# Patient Record
Sex: Male | Born: 1985 | Race: White | Hispanic: No | Marital: Married | Smoking: Never smoker
Health system: Southern US, Community
[De-identification: ages and names within clinical notes are randomized; demographics above are authoritative.]

## PROBLEM LIST (undated history)

## (undated) DIAGNOSIS — R1013 Epigastric pain: Secondary | ICD-10-CM

## (undated) DIAGNOSIS — R109 Unspecified abdominal pain: Secondary | ICD-10-CM

## (undated) DIAGNOSIS — G8929 Other chronic pain: Secondary | ICD-10-CM

## (undated) DIAGNOSIS — F419 Anxiety disorder, unspecified: Secondary | ICD-10-CM

## (undated) DIAGNOSIS — I10 Essential (primary) hypertension: Secondary | ICD-10-CM

## (undated) DIAGNOSIS — F329 Major depressive disorder, single episode, unspecified: Secondary | ICD-10-CM

## (undated) DIAGNOSIS — G589 Mononeuropathy, unspecified: Secondary | ICD-10-CM

## (undated) DIAGNOSIS — F32A Depression, unspecified: Secondary | ICD-10-CM

## (undated) DIAGNOSIS — M1990 Unspecified osteoarthritis, unspecified site: Secondary | ICD-10-CM

## (undated) HISTORY — DX: Epigastric pain: R10.13

## (undated) HISTORY — DX: Major depressive disorder, single episode, unspecified: F32.9

## (undated) HISTORY — PX: APPENDECTOMY: SHX54

## (undated) HISTORY — DX: Anxiety disorder, unspecified: F41.9

## (undated) HISTORY — DX: Mononeuropathy, unspecified: G58.9

## (undated) HISTORY — DX: Depression, unspecified: F32.A

## (undated) HISTORY — DX: Morbid (severe) obesity due to excess calories: E66.01

---
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
## 2006-04-05 ENCOUNTER — Emergency Department (HOSPITAL_COMMUNITY): Admission: EM | Admit: 2006-04-05 | Discharge: 2006-04-05 | Payer: Self-pay | Admitting: Emergency Medicine

## 2013-11-21 ENCOUNTER — Ambulatory Visit (INDEPENDENT_AMBULATORY_CARE_PROVIDER_SITE_OTHER): Payer: BC Managed Care – PPO | Admitting: Family Medicine

## 2013-11-21 ENCOUNTER — Encounter: Payer: Self-pay | Admitting: Family Medicine

## 2013-11-21 VITALS — BP 138/88 | Temp 98.7°F | Ht 75.0 in | Wt 319.0 lb

## 2013-11-21 DIAGNOSIS — R109 Unspecified abdominal pain: Secondary | ICD-10-CM

## 2013-11-21 DIAGNOSIS — R3589 Other polyuria: Secondary | ICD-10-CM

## 2013-11-21 DIAGNOSIS — R358 Other polyuria: Secondary | ICD-10-CM

## 2013-11-21 LAB — HEPATIC FUNCTION PANEL
ALBUMIN: 4 g/dL (ref 3.5–5.2)
ALT: 30 U/L (ref 0–53)
AST: 25 U/L (ref 0–37)
Alkaline Phosphatase: 67 U/L (ref 39–117)
BILIRUBIN INDIRECT: 0.2 mg/dL (ref 0.2–1.2)
Bilirubin, Direct: 0.1 mg/dL (ref 0.0–0.3)
Total Bilirubin: 0.3 mg/dL (ref 0.3–1.2)
Total Protein: 7.7 g/dL (ref 6.0–8.3)

## 2013-11-21 LAB — CBC WITH DIFFERENTIAL/PLATELET
Basophils Absolute: 0 10*3/uL (ref 0.0–0.1)
Basophils Relative: 0 % (ref 0–1)
Eosinophils Absolute: 0.1 10*3/uL (ref 0.0–0.7)
Eosinophils Relative: 1 % (ref 0–5)
HCT: 44.5 % (ref 39.0–52.0)
HEMOGLOBIN: 15.4 g/dL (ref 13.0–17.0)
LYMPHS ABS: 2.5 10*3/uL (ref 0.7–4.0)
LYMPHS PCT: 22 % (ref 12–46)
MCH: 28.7 pg (ref 26.0–34.0)
MCHC: 34.6 g/dL (ref 30.0–36.0)
MCV: 83 fL (ref 78.0–100.0)
MONOS PCT: 9 % (ref 3–12)
Monocytes Absolute: 1 10*3/uL (ref 0.1–1.0)
NEUTROS ABS: 7.8 10*3/uL — AB (ref 1.7–7.7)
NEUTROS PCT: 68 % (ref 43–77)
Platelets: 312 10*3/uL (ref 150–400)
RBC: 5.36 MIL/uL (ref 4.22–5.81)
RDW: 12.8 % (ref 11.5–15.5)
WBC: 11.4 10*3/uL — AB (ref 4.0–10.5)

## 2013-11-21 LAB — AMYLASE: Amylase: 49 U/L (ref 0–105)

## 2013-11-21 LAB — POCT URINALYSIS DIPSTICK
SPEC GRAV UA: 1.02
pH, UA: 5

## 2013-11-21 LAB — POCT GLUCOSE (DEVICE FOR HOME USE): POC Glucose: 98 mg/dl (ref 70–99)

## 2013-11-21 LAB — LIPASE: Lipase: 30 U/L (ref 11–59)

## 2013-11-21 MED ORDER — PANTOPRAZOLE SODIUM 40 MG PO TBEC: 40.0000 mg | DELAYED_RELEASE_TABLET | Freq: Every day | ORAL | Status: DC

## 2013-11-21 NOTE — Progress Notes (Signed)
   Subjective:    Patient ID: Donald [name], male    DOB: 1986-01-06, 28 y.o.   MRN: [id number]  Abdominal Pain This is a new problem. The current episode started in the past 7 days. The pain is located in the left flank and right flank. The quality of the pain is sharp. Associated symptoms include a fever, headaches, nausea and vomiting. Associated symptoms comments: Chest pain. The pain is aggravated by eating. Treatments tried: ibuprofen for headache.   Present since Friuday Pains fluctuates. Peak 10/10 Some nausea occas vomiting No D No fever  He states the pain started off mild but over the past several days is having some sharp pains in the epigastric region. No chest tightness no shortness of breath  Review of Systems  Constitutional: Positive for fever.  Gastrointestinal: Positive for nausea, vomiting and abdominal pain.  Neurological: Positive for headaches.       Objective:   Physical Exam Neck no masses, lungs are clear respiratory rate normal, heart regular no murmurs, abdomen is soft with mild epigastric tenderness no guarding or rebound, skin warm dry       Assessment & Plan:  Abdominal pain discomfort probable gastritis proton pump inhibitor prescribed in addition to this minimize caffeine plus also lab work ordered await the results of these tests  If not improved over the course of the next 10-14 days consider gastroenterology referral for possible EGD. certainly if patient gets significantly worse over the next several days go to ER. Call if problems.  Urinalysis looks normal I find no evidence of infection I doubt kidney stones.

## 2013-11-22 NOTE — Progress Notes (Signed)
Patient notified and verbalized understanding of the test results. I told him he could try taking Tylenol for the pain, if that does not help, to call us back.

## 2014-03-02 ENCOUNTER — Ambulatory Visit (INDEPENDENT_AMBULATORY_CARE_PROVIDER_SITE_OTHER): Payer: BLUE CROSS/BLUE SHIELD | Admitting: Family Medicine

## 2014-03-02 ENCOUNTER — Encounter: Payer: Self-pay | Admitting: Family Medicine

## 2014-03-02 VITALS — BP 128/82 | Ht 75.0 in | Wt 318.6 lb

## 2014-03-02 DIAGNOSIS — K2990 Gastroduodenitis, unspecified, without bleeding: Secondary | ICD-10-CM

## 2014-03-02 DIAGNOSIS — R1013 Epigastric pain: Secondary | ICD-10-CM

## 2014-03-02 DIAGNOSIS — K2970 Gastritis, unspecified, without bleeding: Secondary | ICD-10-CM

## 2014-03-02 MED ORDER — PANTOPRAZOLE SODIUM 40 MG PO TBEC: 40.0000 mg | DELAYED_RELEASE_TABLET | Freq: Every day | ORAL | Status: DC

## 2014-03-02 NOTE — Progress Notes (Signed)
   Subjective:    Patient ID: Donald [name], male    DOB: November 14, 1985, 29 y.o.   MRN: [id number]  Abdominal Pain This is a recurrent problem. The current episode started more than 1 month ago. The onset quality is gradual. The problem occurs intermittently. The problem has been gradually worsening. The pain is located in the epigastric region. The pain is at a severity of 4/10. The pain is mild. The quality of the pain is burning. The abdominal pain radiates to the epigastric region and RUQ. Associated symptoms include anorexia. Pertinent negatives include no diarrhea, fever, hematochezia, melena or nausea. Associated symptoms comments: Worse with dairy products, still having the pain even without the dairy products. The pain is relieved by nothing. He has tried proton pump inhibitors for the symptoms. The treatment provided no relief.    Patient arrives with continued abd pain- Was seen in November 2015 for same sx and given Protinix which helped at first but is no longer working- Patient wonders if it could be gallbladder related. Patient was told if no better would need referral to GI doctor for a scope.   Review of Systems  Constitutional: Negative for fever.  HENT: Negative for congestion.   Respiratory: Negative for cough.   Cardiovascular: Negative for chest pain.  Gastrointestinal: Positive for abdominal pain and anorexia. Negative for nausea, diarrhea, melena and hematochezia.  Genitourinary: Negative for flank pain.       Objective:   Physical Exam  Constitutional: He appears well-nourished. No distress.  Cardiovascular: Normal rate, regular rhythm and normal heart sounds.   No murmur heard. Pulmonary/Chest: Effort normal and breath sounds normal. No respiratory distress.  Abdominal: Soft. Tenderness: mainly than the tenderness in epigastric region there is some tenderness in the right upper quadrant.  Musculoskeletal: He exhibits no edema.  Lymphadenopathy:    He has no  cervical adenopathy.  Neurological: He is alert.  Psychiatric: His behavior is normal.  Vitals reviewed.         Assessment & Plan:  Abdominal pain persistent could well be gastritis could be gallbladder disease. I doubt an ulcer. The patient has been on a PPI for over 60 days he states he initially got better then got worse I would recommend that we continue the PPI. Use Maalox on a when necessary basis minimize acidic foods caffeine's chocolates. Referral to gastroenterology for EGD. In addition to this gallbladder ultrasound. Patient is aware of all of this.

## 2014-03-02 NOTE — Patient Instructions (Signed)
As part of today's visit a referral has been made. This is a process that is handled by our clinical referral specialists. This process requires that we send your medical information to the specialists for their review before they will issue you an appointment. Unfortunately this does take time and much of this process is under the responsibility of the specialists. Our referral specialist will make certain that your insurance company is notified as well as the physician group that we are referring you to for your problem. Emergent referrals are made as quick as possible. Most standard referrals often take 7-10 days before we hear from the specialists office when they can see you. If you have not heard when your appointment is from us or the referral specialists within 7-10 days please call us regarding this referral.   May use Maalox up to 2 tablespoons 4 times a day as needed for abdominal pain  We will set you up with the GI doctor  Please do your ultrasound/ this will look at your Gallbladder

## 2014-03-06 ENCOUNTER — Encounter: Payer: Self-pay | Admitting: Internal Medicine

## 2014-03-06 ENCOUNTER — Ambulatory Visit (HOSPITAL_COMMUNITY)
Admission: RE | Admit: 2014-03-06 | Discharge: 2014-03-06 | Disposition: A | Discharge status: Home / Self Care | Payer: BLUE CROSS/BLUE SHIELD | Source: Ambulatory Visit | Attending: Family Medicine | Admitting: Family Medicine

## 2014-03-06 DIAGNOSIS — G8929 Other chronic pain: Secondary | ICD-10-CM | POA: Insufficient documentation

## 2014-03-06 DIAGNOSIS — E669 Obesity, unspecified: Secondary | ICD-10-CM | POA: Diagnosis not present

## 2014-03-06 DIAGNOSIS — R1013 Epigastric pain: Secondary | ICD-10-CM | POA: Insufficient documentation

## 2014-03-20 ENCOUNTER — Encounter: Payer: Self-pay | Admitting: Nurse Practitioner

## 2014-03-20 ENCOUNTER — Other Ambulatory Visit: Payer: Self-pay

## 2014-03-20 ENCOUNTER — Ambulatory Visit (INDEPENDENT_AMBULATORY_CARE_PROVIDER_SITE_OTHER): Payer: BLUE CROSS/BLUE SHIELD | Admitting: Nurse Practitioner

## 2014-03-20 VITALS — BP 127/78 | HR 89 | Temp 99.0°F | Ht 74.0 in | Wt 316.8 lb

## 2014-03-20 DIAGNOSIS — G8929 Other chronic pain: Secondary | ICD-10-CM

## 2014-03-20 DIAGNOSIS — R1013 Epigastric pain: Secondary | ICD-10-CM

## 2014-03-20 MED ORDER — RANITIDINE HCL 150 MG PO TABS
150.0000 mg | ORAL_TABLET | Freq: Every day | ORAL | Status: DC
Start: 2014-03-20 — End: 2015-04-10

## 2014-03-20 MED ORDER — SUCRALFATE 1 GM/10ML PO SUSP: 1.0000 g | Freq: Three times a day (TID) | ORAL | Status: DC | PRN

## 2014-03-20 NOTE — Assessment & Plan Note (Signed)
Persistent dyspepsia and epigastric pain initially improved with Protonix but has since returned. Likely esophagitis/gastritis. Needs EGD workup for persistent dyspepsia on PPI. In the meantime start Zantac 150 mg q hs and Carafate tid prn breakthrough symptoms. Abdominal US wnl, no gallstones. Also counseled to avoid dairy products for now as these seem to exacerbate his symptoms. No red flag/warning signs. Return for follow-up after EGD in approximately 8 weeks.  Proceed with EGD with Dr. Jena Gaussourk in near future: the risks, benefits, and alternatives have been discussed with the patient in detail. The patient states understanding and desires to proceed.

## 2014-03-20 NOTE — Assessment & Plan Note (Signed)
Persistent dyspepsia and epigastric pain initially improved with Protonix but has since returned. Likely esophagitis/gastritis. Needs EGD workup for persistent dyspepsia on PPI. In the meantime start Zantac 150 mg q hs and Carafate tid prn breakthrough symptoms. Abdominal US wnl, no gallstones. Also counseled to avoid dairy products for now as these seem to exacerbate his symptoms. No red flag/warning signs. Return for follow-up after EGD in approximately 8 weeks.  Proceed with EGD with Dr. Rourk in near future: the risks, benefits, and alternatives have been discussed with the patient in detail. The patient states understanding and desires to proceed.  

## 2014-03-20 NOTE — Progress Notes (Signed)
Primary Care Physician:  Lilyan Punt, MD Primary Gastroenterologist:  Dr. Jena Gauss  Chief Complaint  Patient presents with  . Abdominal Pain    x 2 months  . Nausea    HPI:   29 year old male presents on referral from PCP for abdominal pain which started in January of this year (approximately 2 months ago). His pain was intermittent and epigastric and worsening and described to PCP as burning and radiating to the RUQ. Denied GI bleeding. Associated worsening symptoms with dairy products and has tried PPI without relief. Was initially tried on Protonix by PCP with initial improvement but no longer working. Abd US/RUQ with possible fatty infiltration of the liver, no gallstones, obscured pancreas with bowel gas, and slight prominence of the spleen. Last labs included normal Amylase/Lipase, normal HFP.  Today he states he's having continued epigastric pain and bloating with pain that radiates to his RUQ and sides. Pain is sharp/burning but with a constant dull underlying pain. Pain can be intermittent at times and other times it is constant. Has occasional nausea, denies vomiting. Denies dysphagia, regurgitation, denies acidic or bitter taste in his mouth. Denies hematochezia or melena. Has not tried the Maalox like recommended by his PCP. States nothing has helped. Symptoms all started in January. No associated timing, occurs at random. Has a bowel movement typically once a week at the rarest to every couple/few days. Drinks minimal water, no fiber supplements, eats daily vegetables. Used to take NSAIDs occasionally for headache but has stopped taking them on his PCP's advice. Denies ASA powders. Symptoms tend to be worse with daily products. Denies any other upper or lower GI problems. Denies fever, chills, unintended weight loss, decrease in appetite/poor appetite, chest pain, dyspnea.  Past Medical History  Diagnosis Date  . Dyspepsia   . Pinched nerve     in his back    Past Surgical  History  Procedure Laterality Date  . None      None to date 03/20/14    Current Outpatient Prescriptions  Medication Sig Dispense Refill  . pantoprazole (PROTONIX) 40 MG tablet Take 1 tablet (40 mg total) by mouth daily. 30 tablet 3  . Vitamin D, Ergocalciferol, (DRISDOL) 50000 UNITS CAPS capsule Take 50,000 Units by mouth every 7 (seven) days.     No current facility-administered medications for this visit.    Allergies as of 03/20/2014  . (No Known Allergies)    Family History  Problem Relation Age of Onset  . Colon cancer Neg Hx   . Stomach cancer Neg Hx   . Diabetes Father   . Hypertension Father     History   Social History  . Marital Status: Single    Spouse Name: N/A  . Number of Children: N/A  . Years of Education: N/A   Occupational History  . Not on file.   Social History Main Topics  . Smoking status: Never Smoker   . Smokeless tobacco: Not on file  . Alcohol Use: No     Comment: rarely, "once in a bue moon"  . Drug Use: No  . Sexual Activity: Not on file   Other Topics Concern  . Not on file   Social History Narrative    Review of Systems: Gen: Denies any fever, chills, fatigue, weight loss, lack of appetite.  CV: Denies chest pain, heart palpitations, syncope.  Resp: Denies shortness of breath at rest or with exertion. Denies wheezing or cough.  GI: See HPI. Denies dysphagia or  odynophagia. Denies jaundice, hematemesis. MS: Denies joint pain, muscle weakness, cramps, or limitation of movement.  Derm: Denies rash, itching, dry skin Psych: Denies depression, anxiety, memory loss, and confusion Heme: Denies bruising, bleeding, and enlarged lymph nodes.  Physical Exam: BP 127/78 mmHg  Pulse 89  Temp(Src) 99 F (37.2 C) (Oral)  Ht 6\' 2"  (1.88 m)  Wt 316 lb 12.8 oz (143.7 kg)  BMI 40.66 kg/m2 General:   Alert and oriented. Morbidly obese. Pleasant and cooperative. Well-nourished and well-developed.  Head:  Normocephalic and  atraumatic. Eyes:  Without icterus, sclera clear and conjunctiva pink.  Ears:  Normal auditory acuity. Mouth:  No deformity or lesions, oral mucosa pink. No OP edema. Neck:  Supple, without mass or thyromegaly. Lungs:  Clear to auscultation bilaterally. No wheezes, rales, or rhonchi. No distress.  Heart:  S1, S2 present without murmurs appreciated.  Abdomen:  +BS, soft, and non-distended. Mild to moderate epigastric TTP. No HSM noted. No guarding or rebound. No masses appreciated.  Rectal:  Deferred  Msk:  Symmetrical without gross deformities. Normal posture. Pulses:  Normal pulses noted. Extremities:  Without clubbing or edema. Neurologic:  Alert and  oriented x4;  grossly normal neurologically. Skin:  Intact without significant lesions or rashes. Cervical Nodes:  No significant cervical adenopathy. Psych:  Alert and cooperative. Normal mood and affect.     03/20/2014 12:05 PM

## 2014-03-20 NOTE — Progress Notes (Signed)
CC'ED TO PCP 

## 2014-03-20 NOTE — Patient Instructions (Addendum)
1. We are adding a medication to take at night (Zantag 150 mg) to try and get better control of your symptoms 2. We are also adding a medicine you can take up to 3 times a day for severe symptoms (Carafate) 3. We will schedule you for an endoscopy to further evaluate if you have severe erosions in your esophagus or stomach that could be causing your symptoms. 4. Return for evaluation in 8 weeks, after your procedure. 5. Avoid daily products for now.  Place gastroesophageal reflux patient instructions here.

## 2014-03-26 ENCOUNTER — Ambulatory Visit: Payer: BLUE CROSS/BLUE SHIELD | Admitting: Nurse Practitioner

## 2014-04-02 ENCOUNTER — Encounter (HOSPITAL_COMMUNITY)
Admission: RE | Disposition: A | Discharge status: Home / Self Care | Payer: Self-pay | Source: Ambulatory Visit | Attending: Internal Medicine

## 2014-04-02 ENCOUNTER — Encounter (HOSPITAL_COMMUNITY): Payer: Self-pay | Admitting: *Deleted

## 2014-04-02 ENCOUNTER — Ambulatory Visit (HOSPITAL_COMMUNITY)
Admission: RE | Admit: 2014-04-02 | Discharge: 2014-04-02 | Disposition: A | Discharge status: Home / Self Care | Payer: BLUE CROSS/BLUE SHIELD | Source: Ambulatory Visit | Attending: Internal Medicine | Admitting: Internal Medicine

## 2014-04-02 DIAGNOSIS — K449 Diaphragmatic hernia without obstruction or gangrene: Secondary | ICD-10-CM | POA: Diagnosis not present

## 2014-04-02 DIAGNOSIS — K3189 Other diseases of stomach and duodenum: Secondary | ICD-10-CM | POA: Insufficient documentation

## 2014-04-02 DIAGNOSIS — K317 Polyp of stomach and duodenum: Secondary | ICD-10-CM | POA: Insufficient documentation

## 2014-04-02 DIAGNOSIS — R1013 Epigastric pain: Secondary | ICD-10-CM

## 2014-04-02 DIAGNOSIS — Z79899 Other long term (current) drug therapy: Secondary | ICD-10-CM | POA: Diagnosis not present

## 2014-04-02 HISTORY — PX: ESOPHAGOGASTRODUODENOSCOPY: SHX5428

## 2014-04-02 SURGERY — EGD (ESOPHAGOGASTRODUODENOSCOPY)
Anesthesia: Moderate Sedation

## 2014-04-02 MED ORDER — MEPERIDINE HCL 100 MG/ML IJ SOLN
INTRAMUSCULAR | Status: AC
  Filled 2014-04-02: qty 2

## 2014-04-02 MED ORDER — ONDANSETRON HCL 4 MG/2ML IJ SOLN
INTRAMUSCULAR | Status: DC | PRN
  Administered 2014-04-02: 4 mg via INTRAVENOUS

## 2014-04-02 MED ORDER — ONDANSETRON HCL 4 MG/2ML IJ SOLN
INTRAMUSCULAR | Status: AC
  Filled 2014-04-02: qty 2

## 2014-04-02 MED ORDER — STERILE WATER FOR IRRIGATION IR SOLN
Status: DC | PRN
  Administered 2014-04-02: 15:00:00

## 2014-04-02 MED ORDER — LIDOCAINE VISCOUS 2 % MT SOLN
OROMUCOSAL | Status: DC | PRN
  Administered 2014-04-02: 3 mL via OROMUCOSAL

## 2014-04-02 MED ORDER — MEPERIDINE HCL 100 MG/ML IJ SOLN
INTRAMUSCULAR | Status: DC | PRN
  Administered 2014-04-02 (×3): 50 mg via INTRAVENOUS
  Administered 2014-04-02: 25 mg via INTRAVENOUS

## 2014-04-02 MED ORDER — SODIUM CHLORIDE 0.9 % IV SOLN
INTRAVENOUS | Status: DC
  Administered 2014-04-02: 14:00:00 via INTRAVENOUS

## 2014-04-02 MED ORDER — MIDAZOLAM HCL 5 MG/5ML IJ SOLN
INTRAMUSCULAR | Status: AC
  Filled 2014-04-02: qty 10

## 2014-04-02 MED ORDER — MIDAZOLAM HCL 5 MG/5ML IJ SOLN
INTRAMUSCULAR | Status: DC | PRN
  Administered 2014-04-02 (×2): 2 mg via INTRAVENOUS
  Administered 2014-04-02: 1 mg via INTRAVENOUS
  Administered 2014-04-02: 2 mg via INTRAVENOUS

## 2014-04-02 MED ORDER — LIDOCAINE VISCOUS 2 % MT SOLN
OROMUCOSAL | Status: AC
  Filled 2014-04-02: qty 15

## 2014-04-02 NOTE — H&P (View-Only) (Signed)
  Primary Care Physician:  LUKING,SCOTT, MD Primary Gastroenterologist:  Dr. Rourk  Chief Complaint  Patient presents with  . Abdominal Pain    x 2 months  . Nausea    HPI:   29 year old male presents on referral from PCP for abdominal pain which started in January of this year (approximately 2 months ago). His pain was intermittent and epigastric and worsening and described to PCP as burning and radiating to the RUQ. Denied GI bleeding. Associated worsening symptoms with dairy products and has tried PPI without relief. Was initially tried on Protonix by PCP with initial improvement but no longer working. Abd US/RUQ with possible fatty infiltration of the liver, no gallstones, obscured pancreas with bowel gas, and slight prominence of the spleen. Last labs included normal Amylase/Lipase, normal HFP.  Today he states he's having continued epigastric pain and bloating with pain that radiates to his RUQ and sides. Pain is sharp/burning but with a constant dull underlying pain. Pain can be intermittent at times and other times it is constant. Has occasional nausea, denies vomiting. Denies dysphagia, regurgitation, denies acidic or bitter taste in his mouth. Denies hematochezia or melena. Has not tried the Maalox like recommended by his PCP. States nothing has helped. Symptoms all started in January. No associated timing, occurs at random. Has a bowel movement typically once a week at the rarest to every couple/few days. Drinks minimal water, no fiber supplements, eats daily vegetables. Used to take NSAIDs occasionally for headache but has stopped taking them on his PCP's advice. Denies ASA powders. Symptoms tend to be worse with daily products. Denies any other upper or lower GI problems. Denies fever, chills, unintended weight loss, decrease in appetite/poor appetite, chest pain, dyspnea.  Past Medical History  Diagnosis Date  . Dyspepsia   . Pinched nerve     in his back    Past Surgical  History  Procedure Laterality Date  . None      None to date 03/20/14    Current Outpatient Prescriptions  Medication Sig Dispense Refill  . pantoprazole (PROTONIX) 40 MG tablet Take 1 tablet (40 mg total) by mouth daily. 30 tablet 3  . Vitamin D, Ergocalciferol, (DRISDOL) 50000 UNITS CAPS capsule Take 50,000 Units by mouth every 7 (seven) days.     No current facility-administered medications for this visit.    Allergies as of 03/20/2014  . (No Known Allergies)    Family History  Problem Relation Age of Onset  . Colon cancer Neg Hx   . Stomach cancer Neg Hx   . Diabetes Father   . Hypertension Father     History   Social History  . Marital Status: Single    Spouse Name: N/A  . Number of Children: N/A  . Years of Education: N/A   Occupational History  . Not on file.   Social History Main Topics  . Smoking status: Never Smoker   . Smokeless tobacco: Not on file  . Alcohol Use: No     Comment: rarely, "once in a bue moon"  . Drug Use: No  . Sexual Activity: Not on file   Other Topics Concern  . Not on file   Social History Narrative    Review of Systems: Gen: Denies any fever, chills, fatigue, weight loss, lack of appetite.  CV: Denies chest pain, heart palpitations, syncope.  Resp: Denies shortness of breath at rest or with exertion. Denies wheezing or cough.  GI: See HPI. Denies dysphagia or   odynophagia. Denies jaundice, hematemesis. MS: Denies joint pain, muscle weakness, cramps, or limitation of movement.  Derm: Denies rash, itching, dry skin Psych: Denies depression, anxiety, memory loss, and confusion Heme: Denies bruising, bleeding, and enlarged lymph nodes.  Physical Exam: BP 127/78 mmHg  Pulse 89  Temp(Src) 99 F (37.2 C) (Oral)  Ht 6' 2" (1.88 m)  Wt 316 lb 12.8 oz (143.7 kg)  BMI 40.66 kg/m2 General:   Alert and oriented. Morbidly obese. Pleasant and cooperative. Well-nourished and well-developed.  Head:  Normocephalic and  atraumatic. Eyes:  Without icterus, sclera clear and conjunctiva pink.  Ears:  Normal auditory acuity. Mouth:  No deformity or lesions, oral mucosa pink. No OP edema. Neck:  Supple, without mass or thyromegaly. Lungs:  Clear to auscultation bilaterally. No wheezes, rales, or rhonchi. No distress.  Heart:  S1, S2 present without murmurs appreciated.  Abdomen:  +BS, soft, and non-distended. Mild to moderate epigastric TTP. No HSM noted. No guarding or rebound. No masses appreciated.  Rectal:  Deferred  Msk:  Symmetrical without gross deformities. Normal posture. Pulses:  Normal pulses noted. Extremities:  Without clubbing or edema. Neurologic:  Alert and  oriented x4;  grossly normal neurologically. Skin:  Intact without significant lesions or rashes. Cervical Nodes:  No significant cervical adenopathy. Psych:  Alert and cooperative. Normal mood and affect.     03/20/2014 12:05 PM  

## 2014-04-02 NOTE — Op Note (Signed)
[facility] [address] [place], [id number]   ENDOSCOPY PROCEDURE REPORT  PATIENT: Donald [name], Donald [name]  MR#: [id number] BIRTHDATE: 26-May-1985 , 28  yrs. old GENDER: male ENDOSCOPIST: R.  Roetta SessionsMichael Charlett Merkle, MD FACP FACG REFERRED BY:  Lilyan PuntScott Luking, [name].D. PROCEDURE DATE:  04/02/2014 PROCEDURE:  EGD w/ biopsy INDICATIONS:  Epigastric and right upper quadrant abdominal pain. MEDICATIONS: Versed 7 mg IV and Demerol 175 mg IV in divided doses. Zofran 4 mg IV.  Xylocaine gel orally. ASA CLASS:  CONSENT: The risks, benefits, limitations, alternatives and imponderables have been discussed.  The potential for biopsy, esophogeal dilation, etc. have also been reviewed.  Questions have been answered.  All parties agreeable.  Please see the history and physical in the medical record for more information.  DESCRIPTION OF PROCEDURE: After the risks benefits and alternatives of the procedure were thoroughly explained, informed consent was obtained.  The [id number] ([id number] endoscope was introduced through the mouth and advanced to the second portion of the duodenum , limited by Without limitations. The instrument was slowly withdrawn as the mucosa was fully examined.    Normal-appearing tubular esophagus.  Stomach empty patient a couple 1-3 mm benign-appearing fundal gland polyps in the cardia.  Patient had tiny hiatal hernia.  Otherwise, the gastric mucosa appeared normal.  Patent pylorus.  Normal first and second portion of the duodenum.  Retroflexed views revealed a hiatal hernia. One of the gastric polyps was biopsied for histologic study.    The scope was then withdrawn from the patient and the procedure completed.  COMPLICATIONS: There were no immediate complications.  ENDOSCOPIC IMPRESSION: Very small hiatal hernia. Gastric polyps?"status post biopsy. No endoscopic explanation for patient's symptoms  RECOMMENDATIONS: Trial of dexilant 60 mg daily.   If Dexilant does not  abolish symptoms, then further evaluation of the gallbladder via a HIDA scan would be indicated.  REPEAT EXAM:  eSigned:  R. Roetta SessionsMichael Ayauna Mcnay, MD Jerrel IvoryFACP [facility] HospitalFACG 04/02/2014 3:11 PM    CC:  CPT CODES: ICD CODES:  The ICD and CPT codes recommended by this software are interpretations from the data that the clinical staff has captured with the software.  The verification of the translation of this report to the ICD and CPT codes and modifiers is the sole responsibility of the health care institution and practicing physician where this report was generated.  [employer] will not be held responsible for the validity of the ICD and CPT codes included on this report.  AMA assumes no liability for data contained or not contained herein. CPT is a Publishing rights managerregistered trademark of the [employer].  PATIENT NAME:  Donald [name], Donald [name] MR#: [id number]

## 2014-04-02 NOTE — Discharge Instructions (Addendum)
EGD Discharge instructions Please read the instructions outlined below and refer to this sheet in the next few weeks. These discharge instructions provide you with general information on caring for yourself after you leave the hospital. Your doctor may also give you specific instructions. While your treatment has been planned according to the most current medical practices available, unavoidable complications occasionally occur. If you have any problems or questions after discharge, please call your doctor. ACTIVITY  You may resume your regular activity but move at a slower pace for the next 24 hours.   Take frequent rest periods for the next 24 hours.   Walking will help expel (get rid of) the air and reduce the bloated feeling in your abdomen.   No driving for 24 hours (because of the anesthesia (medicine) used during the test).   You may shower.   Do not sign any important legal documents or operate any machinery for 24 hours (because of the anesthesia used during the test).  NUTRITION  Drink plenty of fluids.   You may resume your normal diet.   Begin with a light meal and progress to your normal diet.   Avoid alcoholic beverages for 24 hours or as instructed by your caregiver.  MEDICATIONS  You may resume your normal medications unless your caregiver tells you otherwise.  WHAT YOU CAN EXPECT TODAY  You may experience abdominal discomfort such as a feeling of fullness or gas pains.  FOLLOW-UP  Your doctor will discuss the results of your test with you.  SEEK IMMEDIATE MEDICAL ATTENTION IF ANY OF THE FOLLOWING OCCUR:  Excessive nausea (feeling sick to your stomach) and/or vomiting.   Severe abdominal pain and distention (swelling).   Trouble swallowing.   Temperature over 101 F (37.8 C).   Rectal bleeding or vomiting of blood.     GERD information provided  Stop Zantac; try three-week course of the dexilant 60 mg daily -go by my office for a 3 week supply of  samples.  If this new medication does not help your symptoms, then further evaluation of your gallbladder would be needed. \ Gastroesophageal Reflux Disease, Adult Gastroesophageal reflux disease (GERD) happens when acid from your stomach flows up into the esophagus. When acid comes in contact with the esophagus, the acid causes soreness (inflammation) in the esophagus. Over time, GERD may create small holes (ulcers) in the lining of the esophagus. CAUSES  Increased body weight. This puts pressure on the stomach, making acid rise from the stomach into the esophagus. Smoking. This increases acid production in the stomach. Drinking alcohol. This causes decreased pressure in the lower esophageal sphincter (valve or ring of muscle between the esophagus and stomach), allowing acid from the stomach into the esophagus. Late evening meals and a full stomach. This increases pressure and acid production in the stomach. A malformed lower esophageal sphincter. Sometimes, no cause is found. SYMPTOMS  Burning pain in the lower part of the mid-chest behind the breastbone and in the mid-stomach area. This may occur twice a week or more often. Trouble swallowing. Sore throat. Dry cough. Asthma-like symptoms including chest tightness, shortness of breath, or wheezing. DIAGNOSIS  Your caregiver may be able to diagnose GERD based on your symptoms. In some cases, X-rays and other tests may be done to check for complications or to check the condition of your stomach and esophagus. TREATMENT  Your caregiver may recommend over-the-counter or prescription medicines to help decrease acid production. Ask your caregiver before starting or adding any new medicines.  HOME CARE INSTRUCTIONS  Change the factors that you can control. Ask your caregiver for guidance concerning weight loss, quitting smoking, and alcohol consumption. Avoid foods and drinks that make your symptoms worse, such as: Caffeine or alcoholic  drinks. Chocolate. Peppermint or mint flavorings. Garlic and onions. Spicy foods. Citrus fruits, such as oranges, lemons, or limes. Tomato-based foods such as sauce, chili, salsa, and pizza. Fried and fatty foods. Avoid lying down for the 3 hours prior to your bedtime or prior to taking a nap. Eat small, frequent meals instead of large meals. Wear loose-fitting clothing. Do not wear anything tight around your waist that causes pressure on your stomach. Raise the head of your bed 6 to 8 inches with wood blocks to help you sleep. Extra pillows will not help. Only take over-the-counter or prescription medicines for pain, discomfort, or fever as directed by your caregiver. Do not take aspirin, ibuprofen, or other nonsteroidal anti-inflammatory drugs (NSAIDs). SEEK IMMEDIATE MEDICAL CARE IF:  You have pain in your arms, neck, jaw, teeth, or back. Your pain increases or changes in intensity or duration. You develop nausea, vomiting, or sweating (diaphoresis). You develop shortness of breath, or you faint. Your vomit is green, yellow, black, or looks like coffee grounds or blood. Your stool is red, bloody, or black. These symptoms could be signs of other problems, such as heart disease, gastric bleeding, or esophageal bleeding. MAKE SURE YOU:  Understand these instructions. Will watch your condition. Will get help right away if you are not doing well or get worse. Document Released: 10/08/2004 Document Revised: 03/23/2011 Document Reviewed: 07/18/2010 [facility] Patient Information 2015 [place], [place]. This information is not intended to replace advice given to you by your health care provider. Make sure you discuss any questions you have with your health care provider.

## 2014-04-02 NOTE — Interval H&P Note (Signed)
History and Physical Interval Note:  04/02/2014 2:43 PM  Donald [name]  has presented today for surgery, with the diagnosis of dyspepsia, epigstric pain  The various methods of treatment have been discussed with the patient and family. After consideration of risks, benefits and other options for treatment, the patient has consented to  Procedure(s) with comments: ESOPHAGOGASTRODUODENOSCOPY (EGD) (N/A) - 245pm as a surgical intervention .  The patient's history has been reviewed, patient examined, no change in status, stable for surgery.  I have reviewed the patient's chart and labs.  Questions were answered to the patient's satisfaction.     Donald [name]  No change. EGD per plan.The risks, benefits, limitations, alternatives and imponderables have been reviewed with the patient. Potential for esophageal dilation, biopsy, etc. have also been reviewed.  Questions have been answered. All parties agreeable.

## 2014-04-04 ENCOUNTER — Telehealth: Payer: Self-pay | Admitting: Internal Medicine

## 2014-04-04 ENCOUNTER — Other Ambulatory Visit: Payer: Self-pay

## 2014-04-04 ENCOUNTER — Encounter (HOSPITAL_COMMUNITY): Payer: Self-pay | Admitting: Internal Medicine

## 2014-04-04 DIAGNOSIS — R109 Unspecified abdominal pain: Secondary | ICD-10-CM

## 2014-04-04 NOTE — Telephone Encounter (Signed)
Continue with my recommendations on the day of the endoscopy. Continue Dexilant 60 mg daily. I would wait until at least the first of next week before drawing any conclusions as to the effectiveness of the Dexilant. If persisting symptoms, would pursue a HIDA scan. In fact, might go ahead and make an appointment for a HIDA with [facility]  Tuesday or Wednesday of next week.

## 2014-04-04 NOTE — Telephone Encounter (Signed)
Spoke with pts wife because he is at work today. Pt is still having R side abd pain. She said he has swelling in his abd that gets worse after he eats. He does have some nausea but no vomiting. No fever. He did start dexilant 2 days ago.  Any further recommendations?

## 2014-04-04 NOTE — Telephone Encounter (Signed)
No pre cert needed for HIDA . HIDA is set up for 04/10/2014. Pt is aware to be fasting and not to take any pain medications.

## 2014-04-04 NOTE — Telephone Encounter (Signed)
Pt's wife called to say that patient had EGD done on Monday by [facility] and is having chest and side pain with swelling. Please call with recommendations [phone]

## 2014-04-04 NOTE — Telephone Encounter (Signed)
pts wife is aware- she said it was ok to set up HIDA scan. Please schedule. She said either really early in the morning or late in the afternoon if possible.

## 2014-04-10 ENCOUNTER — Encounter (HOSPITAL_COMMUNITY): Payer: Self-pay

## 2014-04-10 ENCOUNTER — Ambulatory Visit (HOSPITAL_COMMUNITY)
Admission: RE | Admit: 2014-04-10 | Discharge: 2014-04-10 | Disposition: A | Discharge status: Home / Self Care | Payer: BLUE CROSS/BLUE SHIELD | Source: Ambulatory Visit | Attending: Internal Medicine | Admitting: Internal Medicine

## 2014-04-10 DIAGNOSIS — R109 Unspecified abdominal pain: Secondary | ICD-10-CM | POA: Insufficient documentation

## 2014-04-10 MED ORDER — SODIUM CHLORIDE 0.9 % IJ SOLN
INTRAMUSCULAR | Status: AC
  Filled 2014-04-10: qty 18

## 2014-04-10 MED ORDER — TECHNETIUM TC 99M MEBROFENIN IV KIT
5.0000 | PACK | Freq: Once | INTRAVENOUS | Status: AC | PRN
  Administered 2014-04-10: 5 via INTRAVENOUS

## 2014-04-10 MED ORDER — SINCALIDE 5 MCG IJ SOLR
INTRAMUSCULAR | Status: AC
  Administered 2014-04-10: 2.87 ug via INTRAVENOUS
  Filled 2014-04-10: qty 5

## 2014-04-10 MED ORDER — STERILE WATER FOR INJECTION IJ SOLN
INTRAMUSCULAR | Status: AC
  Administered 2014-04-10: 2.87 mL via INTRAVENOUS
  Filled 2014-04-10: qty 10

## 2014-04-18 NOTE — Progress Notes (Signed)
PATIENT HAS APPOINTMENT 05/15/14

## 2014-04-29 ENCOUNTER — Encounter: Payer: Self-pay | Admitting: Internal Medicine

## 2014-05-15 ENCOUNTER — Encounter: Payer: Self-pay | Admitting: Nurse Practitioner

## 2014-05-15 ENCOUNTER — Telehealth: Payer: Self-pay | Admitting: Internal Medicine

## 2014-05-15 ENCOUNTER — Ambulatory Visit: Payer: BLUE CROSS/BLUE SHIELD | Admitting: Nurse Practitioner

## 2014-05-15 NOTE — Telephone Encounter (Signed)
PATIENT WAS A NO SHOW AND LETTER WAS SENT  °

## 2014-05-25 NOTE — Telephone Encounter (Signed)
Noted  

## 2015-04-10 ENCOUNTER — Encounter: Payer: Self-pay | Admitting: Family Medicine

## 2015-04-10 ENCOUNTER — Ambulatory Visit (INDEPENDENT_AMBULATORY_CARE_PROVIDER_SITE_OTHER): Payer: BLUE CROSS/BLUE SHIELD | Admitting: Family Medicine

## 2015-04-10 VITALS — BP 138/84 | Temp 98.4°F | Ht 75.0 in | Wt 343.0 lb

## 2015-04-10 DIAGNOSIS — J301 Allergic rhinitis due to pollen: Secondary | ICD-10-CM | POA: Diagnosis not present

## 2015-04-10 DIAGNOSIS — J019 Acute sinusitis, unspecified: Secondary | ICD-10-CM | POA: Diagnosis not present

## 2015-04-10 DIAGNOSIS — B9689 Other specified bacterial agents as the cause of diseases classified elsewhere: Secondary | ICD-10-CM

## 2015-04-10 MED ORDER — AMOXICILLIN-POT CLAVULANATE 875-125 MG PO TABS: 1.0000 | ORAL_TABLET | Freq: Two times a day (BID) | ORAL | Status: DC

## 2015-04-10 MED ORDER — FLUTICASONE PROPIONATE 50 MCG/ACT NA SUSP: 2.0000 | Freq: Every day | NASAL | Status: DC

## 2015-04-10 NOTE — Progress Notes (Signed)
   Subjective:    Patient ID: Donald [name], male    DOB: [birth year], 30 y.o.   MRN: [id number]  Sinusitis This is a new problem. Episode onset: 3 days. Associated symptoms include congestion, coughing and headaches. Pertinent negatives include no ear pain. (Teeth and gum pain, Runny nose, ear pain, cough ) Past treatments include nothing.    Works outside at the [employer] having a lot of head congestion drainage coughing some sneezing also sinus pressure denies vomiting diarrhea denies high fever relates sinuses are aching in his teeth.  Review of Systems  Constitutional: Negative for fever and activity change.  HENT: Positive for congestion and rhinorrhea. Negative for ear pain.   Eyes: Negative for discharge.  Respiratory: Positive for cough. Negative for wheezing.   Cardiovascular: Negative for chest pain.  Neurological: Positive for headaches.       Objective:   Physical Exam  Constitutional: He appears well-developed.  HENT:  Head: Normocephalic.  Mouth/Throat: Oropharynx is clear and moist. No oropharyngeal exudate.  Neck: Normal range of motion.  Cardiovascular: Normal rate, regular rhythm and normal heart sounds.   No murmur heard. Pulmonary/Chest: Effort normal and breath sounds normal. He has no wheezes.  Lymphadenopathy:    He has no cervical adenopathy.  Neurological: He exhibits normal muscle tone.  Skin: Skin is warm and dry.  Nursing note and vitals reviewed.         Assessment & Plan:  Viral syndrome Secondary rhinosinusitis Allergies Fexofenadine daily Flonase daily Antibiotic twice daily for the next 10 days warning signs discussed follow-up if problems

## 2015-09-25 ENCOUNTER — Telehealth: Payer: Self-pay | Admitting: Family Medicine

## 2015-09-25 NOTE — Telephone Encounter (Signed)
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  Please advise.

## 2015-09-27 NOTE — Telephone Encounter (Signed)
Given that this is a legal form in his asking detailed questions regarding if we feel patient overall is healthy we have to see them in order to sign this

## 2015-09-27 NOTE — Telephone Encounter (Signed)
Notified wife that he needs an office visit first. Wife transferred to front desk to schedule appointment.

## 2015-10-18 ENCOUNTER — Ambulatory Visit (INDEPENDENT_AMBULATORY_CARE_PROVIDER_SITE_OTHER): Payer: BLUE CROSS/BLUE SHIELD | Admitting: Family Medicine

## 2015-10-18 ENCOUNTER — Encounter: Payer: Self-pay | Admitting: Family Medicine

## 2015-10-18 VITALS — BP 130/78 | Ht 75.0 in | Wt 337.6 lb

## 2015-10-18 DIAGNOSIS — Z0000 Encounter for general adult medical examination without abnormal findings: Secondary | ICD-10-CM

## 2015-10-18 DIAGNOSIS — R1011 Right upper quadrant pain: Secondary | ICD-10-CM

## 2015-10-18 NOTE — Patient Instructions (Signed)
DASH Eating Plan  DASH stands for "Dietary Approaches to Stop Hypertension." The DASH eating plan is a healthy eating plan that has been shown to reduce high blood pressure (hypertension). Additional health benefits may include reducing the risk of type 2 diabetes mellitus, heart disease, and stroke. The DASH eating plan may also help with weight loss.  WHAT DO I NEED TO KNOW ABOUT THE DASH EATING PLAN?  For the DASH eating plan, you will follow these general guidelines:  · Choose foods with a percent daily value for sodium of less than 5% (as listed on the food label).  · Use salt-free seasonings or herbs instead of table salt or sea salt.  · Check with your health care provider or pharmacist before using salt substitutes.  · Eat lower-sodium products, often labeled as "lower sodium" or "no salt added."  · Eat fresh foods.  · Eat more vegetables, fruits, and low-fat dairy products.  · Choose whole grains. Look for the word "whole" as the first word in the ingredient list.  · Choose fish and skinless chicken or turkey more often than red meat. Limit fish, poultry, and meat to 6 oz (170 g) each day.  · Limit sweets, desserts, sugars, and sugary drinks.  · Choose heart-healthy fats.  · Limit cheese to 1 oz (28 g) per day.  · Eat more home-cooked food and less restaurant, buffet, and fast food.  · Limit fried foods.  · Cook foods using methods other than frying.  · Limit canned vegetables. If you do use them, rinse them well to decrease the sodium.  · When eating at a restaurant, ask that your food be prepared with less salt, or no salt if possible.  WHAT FOODS CAN I EAT?  Seek help from a dietitian for individual calorie needs.  Grains  Whole grain or whole wheat bread. Brown rice. Whole grain or whole wheat pasta. Quinoa, bulgur, and whole grain cereals. Low-sodium cereals. Corn or whole wheat flour tortillas. Whole grain cornbread. Whole grain crackers. Low-sodium crackers.  Vegetables  Fresh or frozen vegetables  (raw, steamed, roasted, or grilled). Low-sodium or reduced-sodium tomato and vegetable juices. Low-sodium or reduced-sodium tomato sauce and paste. Low-sodium or reduced-sodium canned vegetables.   Fruits  All fresh, canned (in natural juice), or frozen fruits.  Meat and Other Protein Products  Ground beef (85% or leaner), grass-fed beef, or beef trimmed of fat. Skinless chicken or turkey. Ground chicken or turkey. Pork trimmed of fat. All fish and seafood. Eggs. Dried beans, peas, or lentils. Unsalted nuts and seeds. Unsalted canned beans.  Dairy  Low-fat dairy products, such as skim or 1% milk, 2% or reduced-fat cheeses, low-fat ricotta or cottage cheese, or plain low-fat yogurt. Low-sodium or reduced-sodium cheeses.  Fats and Oils  Tub margarines without trans fats. Light or reduced-fat mayonnaise and salad dressings (reduced sodium). Avocado. Safflower, olive, or canola oils. Natural peanut or almond butter.  Other  Unsalted popcorn and pretzels.  The items listed above may not be a complete list of recommended foods or beverages. Contact your dietitian for more options.  WHAT FOODS ARE NOT RECOMMENDED?  Grains  White bread. White pasta. White rice. Refined cornbread. Bagels and croissants. Crackers that contain trans fat.  Vegetables  Creamed or fried vegetables. Vegetables in a cheese sauce. Regular canned vegetables. Regular canned tomato sauce and paste. Regular tomato and vegetable juices.  Fruits  Dried fruits. Canned fruit in light or heavy syrup. Fruit juice.  Meat and Other Protein   Products  Fatty cuts of meat. Ribs, chicken wings, bacon, sausage, bologna, salami, chitterlings, fatback, hot dogs, bratwurst, and packaged luncheon meats. Salted nuts and seeds. Canned beans with salt.  Dairy  Whole or 2% milk, cream, half-and-half, and cream cheese. Whole-fat or sweetened yogurt. Full-fat cheeses or blue cheese. Nondairy creamers and whipped toppings. Processed cheese, cheese spreads, or cheese  curds.  Condiments  Onion and garlic salt, seasoned salt, table salt, and sea salt. Canned and packaged gravies. Worcestershire sauce. Tartar sauce. Barbecue sauce. Teriyaki sauce. Soy sauce, including reduced sodium. Steak sauce. Fish sauce. Oyster sauce. Cocktail sauce. Horseradish. Ketchup and mustard. Meat flavorings and tenderizers. Bouillon cubes. Hot sauce. Tabasco sauce. Marinades. Taco seasonings. Relishes.  Fats and Oils  Butter, stick margarine, lard, shortening, ghee, and bacon fat. Coconut, palm kernel, or palm oils. Regular salad dressings.  Other  Pickles and olives. Salted popcorn and pretzels.  The items listed above may not be a complete list of foods and beverages to avoid. Contact your dietitian for more information.  WHERE CAN I FIND MORE INFORMATION?  National Heart, Lung, and Blood Institute: www.nhlbi.nih.gov/health/health-topics/topics/dash/     This information is not intended to replace advice given to you by your health care provider. Make sure you discuss any questions you have with your health care provider.     Document Released: 12/18/2010 Document Revised: 01/19/2014 Document Reviewed: 11/02/2012  Elsevier Interactive Patient Education ©2016 Elsevier Inc[employer]

## 2015-10-18 NOTE — Progress Notes (Signed)
Subjective:    Patient ID: Donald [name], male    DOB: 09/11/1985, 30 y.o.   MRN: [id number]  HPI The patient comes in today for a wellness visit.  This patient states he is trying to watch how he eats he is cut back on sugary drinks he does not exercise but he states he does have a physical job he denies any communicable illness. 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  A review of medications was also completed.  Any needed refills; none  Eating habits: trying to eat healthy  Falls/  MVA accidents in past few months: none  Regular exercise: yes  Specialist pt sees on regular basis: no  Preventative health issues were discussed.   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  HENT: Negative for congestion and rhinorrhea.   Eyes: Negative for discharge.  Respiratory: Negative for cough and wheezing.   Cardiovascular: Negative for chest pain.  Gastrointestinal: Negative for abdominal pain, blood in stool and vomiting.  Genitourinary: Negative for difficulty urinating and frequency.  Musculoskeletal: Negative for neck pain.  Skin: Negative for rash.  Allergic/Immunologic: Negative for environmental allergies and food allergies.  Neurological: Negative for weakness and headaches.  Psychiatric/Behavioral: Negative for agitation.       Objective:   Physical Exam  Constitutional: He appears well-developed and well-nourished.  HENT:  Head: Normocephalic and atraumatic.  Right Ear: External ear normal.  Left Ear: External ear normal.  Nose: Nose normal.  Mouth/Throat: Oropharynx is clear and moist.  Eyes: EOM are normal. Pupils are equal, round, and reactive to light.  Neck: Normal range of motion. Neck supple. No thyromegaly present.    Cardiovascular: Normal rate, regular rhythm and normal heart sounds.   No murmur heard. Pulmonary/Chest: Effort normal and breath sounds normal. No respiratory distress. He has no wheezes.  Abdominal: Soft. Bowel sounds are normal. He exhibits no distension and no mass. There is no tenderness.  Genitourinary: Penis normal.  Musculoskeletal: Normal range of motion. He exhibits no edema.  Lymphadenopathy:    He has no cervical adenopathy.  Neurological: He is alert. He exhibits normal muscle tone.  Skin: Skin is warm and dry. No erythema.  Psychiatric: He has a normal mood and affect. His behavior is normal. Judgment normal.          Assessment & Plan:  Adult wellness-complete.wellness physical was conducted today. Importance of diet and exercise were discussed in detail. In addition to this a discussion regarding safety was also covered. We also reviewed over immunizations and gave recommendations regarding current immunization needed for age. In addition to this additional areas were also touched on including: Preventative health exams needed: Colonoscopy not indicated currently Patient was encouraged to lose weight Healthy diet recommended Avoid sugary drinks Patient perfectly capable of being foster parent no problems noted  Patient was advised yearly wellness exam He is going try to exercise more try to watch his diet avoid sugary drinks and try to lose weight Lab work was given to the patient he will be doing in the near future  Patient does have occasional right upper quadrant discomfort does not radiate into the chest no shortness of breath with it. This happens intermittently only on occasion will last a few minutes to a couple hours  there is no particular triggers. If he gets worse he agrees to come back for further evaluation

## 2015-10-19 LAB — BASIC METABOLIC PANEL
BUN/Creatinine Ratio: 13 (ref 9–20)
BUN: 13 mg/dL (ref 6–20)
CALCIUM: 9.6 mg/dL (ref 8.7–10.2)
CO2: 20 mmol/L (ref 18–29)
Chloride: 100 mmol/L (ref 96–106)
Creatinine, Ser: 1.04 mg/dL (ref 0.76–1.27)
GFR calc Af Amer: 109 mL/min/{1.73_m2} (ref >59)
GFR, EST NON AFRICAN AMERICAN: 95 mL/min/{1.73_m2} (ref >59)
Glucose: 111 mg/dL — ABNORMAL HIGH (ref 65–99)
Potassium: 4.6 mmol/L (ref 3.5–5.2)
Sodium: 139 mmol/L (ref 134–144)

## 2015-10-19 LAB — CBC WITH DIFFERENTIAL/PLATELET
BASOS ABS: 0 10*3/uL (ref 0.0–0.2)
Basos: 0 %
EOS (ABSOLUTE): 0.1 10*3/uL (ref 0.0–0.4)
Eos: 1 %
HEMOGLOBIN: 15 g/dL (ref 12.6–17.7)
Hematocrit: 46.4 % (ref 37.5–51.0)
Immature Grans (Abs): 0 10*3/uL (ref 0.0–0.1)
Immature Granulocytes: 0 %
LYMPHS ABS: 2.4 10*3/uL (ref 0.7–3.1)
Lymphs: 26 %
MCH: 26.8 pg (ref 26.6–33.0)
MCHC: 32.3 g/dL (ref 31.5–35.7)
MCV: 83 fL (ref 79–97)
MONOCYTES: 7 %
Monocytes Absolute: 0.7 10*3/uL (ref 0.1–0.9)
Neutrophils Absolute: 6 10*3/uL (ref 1.4–7.0)
Neutrophils: 66 %
Platelets: 361 10*3/uL (ref 150–379)
RBC: 5.59 x10E6/uL (ref 4.14–5.80)
RDW: 13.5 % (ref 12.3–15.4)
WBC: 9.2 10*3/uL (ref 3.4–10.8)

## 2015-10-19 LAB — HEPATIC FUNCTION PANEL
ALBUMIN: 4.5 g/dL (ref 3.5–5.5)
ALT: 21 IU/L (ref 0–44)
AST: 17 IU/L (ref 0–40)
Alkaline Phosphatase: 79 IU/L (ref 39–117)
Bilirubin Total: 0.3 mg/dL (ref 0.0–1.2)
Bilirubin, Direct: 0.13 mg/dL (ref 0.00–0.40)
Total Protein: 7.2 g/dL (ref 6.0–8.5)

## 2015-10-19 LAB — LIPID PANEL
CHOL/HDL RATIO: 4.5 ratio (ref 0.0–5.0)
CHOLESTEROL TOTAL: 196 mg/dL (ref 100–199)
HDL: 44 mg/dL (ref >39)
LDL Calculated: 123 mg/dL — ABNORMAL HIGH (ref 0–99)
Triglycerides: 143 mg/dL (ref 0–149)
VLDL Cholesterol Cal: 29 mg/dL (ref 5–40)

## 2015-10-19 LAB — LIPASE: Lipase: 31 U/L (ref 13–78)

## 2015-11-18 ENCOUNTER — Encounter: Payer: Self-pay | Admitting: Family Medicine

## 2015-11-18 ENCOUNTER — Ambulatory Visit (INDEPENDENT_AMBULATORY_CARE_PROVIDER_SITE_OTHER): Payer: BLUE CROSS/BLUE SHIELD | Admitting: Family Medicine

## 2015-11-18 VITALS — BP 122/82 | Temp 98.2°F | Ht 75.0 in | Wt 334.6 lb

## 2015-11-18 DIAGNOSIS — B9689 Other specified bacterial agents as the cause of diseases classified elsewhere: Secondary | ICD-10-CM | POA: Diagnosis not present

## 2015-11-18 DIAGNOSIS — J019 Acute sinusitis, unspecified: Secondary | ICD-10-CM

## 2015-11-18 MED ORDER — AMOXICILLIN-POT CLAVULANATE 875-125 MG PO TABS: 1.0000 | ORAL_TABLET | Freq: Two times a day (BID) | ORAL | 0 refills | Status: DC

## 2015-11-18 NOTE — Progress Notes (Signed)
   Subjective:    Patient ID: Donald [name], male    DOB: February 20, 1985, 30 y.o.   MRN: [id number]  Cough  This is a new problem. The current episode started in the past 7 days. Associated symptoms include ear pain, a fever, headaches, nasal congestion, rhinorrhea and a sore throat. Pertinent negatives include no chest pain or wheezing. Treatments tried: OTC cold medicine.  cough congestion drainage sinus pressure denies high fever chills denies sweats nausea vomiting diarrhea    Review of Systems  Constitutional: Positive for fever. Negative for activity change.  HENT: Positive for congestion, ear pain, rhinorrhea and sore throat.   Eyes: Negative for discharge.  Respiratory: Positive for cough. Negative for wheezing.   Cardiovascular: Negative for chest pain.  Neurological: Positive for headaches.       Objective:   Physical Exam  Constitutional: He appears well-developed.  HENT:  Head: Normocephalic.  Mouth/Throat: Oropharynx is clear and moist. No oropharyngeal exudate.  Neck: Normal range of motion.  Cardiovascular: Normal rate, regular rhythm and normal heart sounds.   No murmur heard. Pulmonary/Chest: Effort normal and breath sounds normal. He has no wheezes.  Lymphadenopathy:    He has no cervical adenopathy.  Neurological: He exhibits normal muscle tone.  Skin: Skin is warm and dry.  Nursing note and vitals reviewed.         Assessment & Plan:  Viral syndrome Secondary rhinosinusitis Antibiotics prescribed warning signs discussed follow-up of ongoing

## 2016-10-09 ENCOUNTER — Encounter: Payer: Self-pay | Admitting: Family Medicine

## 2016-10-09 ENCOUNTER — Ambulatory Visit (INDEPENDENT_AMBULATORY_CARE_PROVIDER_SITE_OTHER): Payer: Commercial Managed Care - PPO | Admitting: Family Medicine

## 2016-10-09 VITALS — BP 130/84 | Ht 75.0 in | Wt 331.1 lb

## 2016-10-09 DIAGNOSIS — F411 Generalized anxiety disorder: Secondary | ICD-10-CM | POA: Diagnosis not present

## 2016-10-09 MED ORDER — ALPRAZOLAM 0.5 MG PO TABS: ORAL_TABLET | ORAL | 0 refills | Status: DC

## 2016-10-09 MED ORDER — SERTRALINE HCL 50 MG PO TABS: 50.0000 mg | ORAL_TABLET | Freq: Every day | ORAL | 3 refills | Status: DC

## 2016-10-09 NOTE — Progress Notes (Signed)
   Subjective:    Patient ID: Donald [name], male    DOB: 1985-06-02, 31 y.o.   MRN: [id number]  Anxiety  Presents for initial visit. Onset was 1 to 5 years ago. The problem has been gradually worsening. Symptoms include depressed mood, irritability and nervous/anxious behavior. Symptoms occur most days.    Patient states no other concerns this visit.  Significant time spent with patient 20 minutes discussing anxiety depression side effects of medications medication approach and counseling  Depression screen PHQ 2/9 10/09/2016  Decreased Interest 1  Down, Depressed, Hopeless 0  PHQ - 2 Score 1  Altered sleeping 0  Tired, decreased energy 2  Change in appetite 1  Feeling bad or failure about yourself  0  Trouble concentrating 0  Moving slowly or fidgety/restless 2  Suicidal thoughts 0  PHQ-9 Score 6  Difficult doing work/chores Not difficult at all   GAD 7 : Generalized Anxiety Score 10/09/2016  Nervous, Anxious, on Edge 3  Control/stop worrying 2  Worry too much - different things 3  Trouble relaxing 2  Restless 3  Easily annoyed or irritable 3  Afraid - awful might happen 0  Total GAD 7 Score 16  Anxiety Difficulty Very difficult       Review of Systems  Constitutional: Positive for irritability.  Psychiatric/Behavioral: The patient is nervous/anxious.    He denies being suicidal. He does admit to medication would help him He has had some panic attacks but not frequently Not suicidal or homicidal    Objective:   Physical Exam  Significant length of time spent with patient please see above      Assessment & Plan:  Patient agrees follow-up 3-4 weeks Start Zoloft half tablet daily for the first week then one tablet thereafter Patient should follow-up in approximately 3 weeks as planned sooner if any problems of suicidal immediately stop medicine seek help here or ER counseling recommended but deferred by patient

## 2016-10-28 ENCOUNTER — Ambulatory Visit: Payer: Commercial Managed Care - PPO | Admitting: Family Medicine

## 2016-11-06 ENCOUNTER — Ambulatory Visit (INDEPENDENT_AMBULATORY_CARE_PROVIDER_SITE_OTHER): Payer: Commercial Managed Care - PPO | Admitting: Family Medicine

## 2016-11-06 ENCOUNTER — Encounter: Payer: Self-pay | Admitting: Family Medicine

## 2016-11-06 VITALS — BP 132/82 | Ht 75.0 in | Wt 338.0 lb

## 2016-11-06 DIAGNOSIS — F411 Generalized anxiety disorder: Secondary | ICD-10-CM

## 2016-11-06 DIAGNOSIS — M79604 Pain in right leg: Secondary | ICD-10-CM

## 2016-11-06 MED ORDER — SERTRALINE HCL 50 MG PO TABS: 50.0000 mg | ORAL_TABLET | Freq: Every day | ORAL | 6 refills | Status: DC

## 2016-11-06 MED ORDER — CHLORZOXAZONE 500 MG PO TABS: 500.0000 mg | ORAL_TABLET | Freq: Three times a day (TID) | ORAL | 0 refills | Status: DC | PRN

## 2016-11-06 NOTE — Progress Notes (Signed)
   Subjective:    Patient ID: Jaynie CrumbleBrandon M Finken, male    DOB: [birth year], 31 y.o.   MRN: [id number]  HPIFollow up on anxiety. Needs refills on meds.  No concerns today.  Patient states he uses Zoloft he states his moods are doing great his energy level is very good he denies any major setbacks denies being anxious or nervous or depressed Intermittent right leg pain over the past 12 hours no redness no swelling no tightness no numbness or tingling.  No known injury.    Review of Systems Please see above denies nausea vomiting diarrhea abdominal pain dysuria fever chills sweats cough    Objective:   Physical Exam Lungs clear no crackles heart regular no murmurs pulses normal blood pressure better on recheck moderate obesity right leg circumference of both calves are equal no tenderness in the leg has tight muscles on both sides  GAD 7 and PHQ 9 were reviewed I do not feel the patient has a blood clot in the leg there is no swelling no calf tenderness    Assessment & Plan:  Muscle relaxer at nighttime for the leg over-the-counter anti-inflammatory stretching exercises shown follow-up if problems  Warning signs regarding blood clot were discussed. Follow-up if problems   Generalized anxiety disorder doing much much better on medication continue medication no side effects so far we will follow-up in 6 months follow-up sooner if any worsening conditions

## 2017-01-14 ENCOUNTER — Encounter: Payer: Self-pay | Admitting: Family Medicine

## 2017-01-19 ENCOUNTER — Other Ambulatory Visit: Payer: Self-pay | Admitting: *Deleted

## 2017-01-19 MED ORDER — SERTRALINE HCL 50 MG PO TABS: 50.0000 mg | ORAL_TABLET | Freq: Every day | ORAL | 3 refills | Status: DC

## 2017-01-19 NOTE — Progress Notes (Signed)
Refills sent

## 2017-01-19 NOTE — Progress Notes (Signed)
He may have this +3 refills follow-up office visit in April

## 2017-05-10 ENCOUNTER — Encounter: Payer: Self-pay | Admitting: Family Medicine

## 2017-05-10 ENCOUNTER — Ambulatory Visit (INDEPENDENT_AMBULATORY_CARE_PROVIDER_SITE_OTHER): Payer: Commercial Managed Care - PPO | Admitting: Family Medicine

## 2017-05-10 VITALS — BP 116/82 | Ht 75.0 in | Wt 330.0 lb

## 2017-05-10 DIAGNOSIS — F411 Generalized anxiety disorder: Secondary | ICD-10-CM

## 2017-05-10 HISTORY — DX: Morbid (severe) obesity due to excess calories: E66.01

## 2017-05-10 MED ORDER — SERTRALINE HCL 50 MG PO TABS: 50.0000 mg | ORAL_TABLET | Freq: Every day | ORAL | 1 refills | Status: DC

## 2017-05-10 NOTE — Progress Notes (Signed)
   Subjective:    Patient ID: Donald [name], male    DOB: 09-10-1985, 32 y.o.   MRN: [id number]  HPI  Patient is here today to follow up on GAD.He is currently taking Xanax 0.5 1/2 to one whole tablet BID prn. On Zoloft 50 mg one daily. Seem to be working well for the pt.  Review of Systems  Constitutional: Negative for activity change, fatigue and fever.  HENT: Negative for congestion and rhinorrhea.   Respiratory: Negative for cough and shortness of breath.   Cardiovascular: Negative for chest pain and leg swelling.  Gastrointestinal: Negative for abdominal pain, diarrhea and nausea.  Genitourinary: Negative for dysuria and hematuria.  Neurological: Negative for weakness and headaches.  Psychiatric/Behavioral: Negative for behavioral problems.       Objective:   Physical Exam  Constitutional: He appears well-nourished. No distress.  HENT:  Head: Normocephalic and atraumatic.  Eyes: Right eye exhibits no discharge. Left eye exhibits no discharge.  Neck: No tracheal deviation present.  Cardiovascular: Normal rate, regular rhythm and normal heart sounds.  No murmur heard. Pulmonary/Chest: Effort normal and breath sounds normal. No respiratory distress. He has no wheezes.  Musculoskeletal: He exhibits no edema.  Lymphadenopathy:    He has no cervical adenopathy.  Neurological: He is alert.  Skin: Skin is warm. No rash noted.  Psychiatric: His behavior is normal.  Vitals reviewed.  Patient does understand and is very important for him to lose weight  We discussed watching portions and being very selective with diet staying physically active     Assessment & Plan:  Anxiety under good control medication continue medication  Follow-up in 6 months  Encourage patient exercise watch diet lose weight  Patient will send [place] copy of his lab work

## 2017-08-09 ENCOUNTER — Telehealth: Payer: Self-pay | Admitting: Family Medicine

## 2017-08-09 ENCOUNTER — Encounter: Payer: Self-pay | Admitting: Family Medicine

## 2017-08-09 NOTE — Telephone Encounter (Signed)
Review report of [facility] in results folder.

## 2017-08-09 NOTE — Telephone Encounter (Signed)
Reports shows he has had appendicitis

## 2017-08-13 ENCOUNTER — Ambulatory Visit (INDEPENDENT_AMBULATORY_CARE_PROVIDER_SITE_OTHER): Payer: Commercial Managed Care - PPO | Admitting: Family Medicine

## 2017-08-13 ENCOUNTER — Encounter: Payer: Self-pay | Admitting: Family Medicine

## 2017-08-13 VITALS — BP 134/82 | Temp 98.7°F | Ht 75.0 in | Wt 338.0 lb

## 2017-08-13 DIAGNOSIS — L918 Other hypertrophic disorders of the skin: Secondary | ICD-10-CM

## 2017-08-13 NOTE — Progress Notes (Signed)
   Subjective:    Patient ID: Donald CrumbleBrandon M Brotz, male    DOB: 01-Nov-1985, 32 y.o.   MRN: [id number]  HPIpainful mole on back. Started having pain9 days ago.  Patient relates skin tag on his back denies high fever chills sweats he states this area got irritated when he had a appendicitis surgery recently denies any other particular troubles no fevers   Review of Systems  Constitutional: Negative for activity change, fatigue and fever.  HENT: Negative for congestion and rhinorrhea.   Respiratory: Negative for cough and shortness of breath.   Cardiovascular: Negative for chest pain and leg swelling.  Gastrointestinal: Negative for abdominal pain, diarrhea and nausea.  Genitourinary: Negative for dysuria and hematuria.  Neurological: Negative for weakness and headaches.  Psychiatric/Behavioral: Negative for agitation and behavioral problems.       Objective:   Physical Exam   Multiple skin tags on the back for 1 of them appears to have compromised the blood flow to it it needs to be removed there is minimal redness with it is removed without difficulty by slipping it with a pair of iris scissors bandage was placed     Assessment & Plan:  If any significant infection issues let us know otherwise should gradually get better with time  Referral to dermatology for skin tag removal

## 2017-08-17 ENCOUNTER — Encounter: Payer: Self-pay | Admitting: Family Medicine

## 2017-11-09 ENCOUNTER — Encounter: Payer: Self-pay | Admitting: Family Medicine

## 2017-11-09 ENCOUNTER — Ambulatory Visit (INDEPENDENT_AMBULATORY_CARE_PROVIDER_SITE_OTHER): Payer: Commercial Managed Care - PPO | Admitting: Family Medicine

## 2017-11-09 VITALS — BP 130/84 | Ht 75.0 in | Wt 343.0 lb

## 2017-11-09 DIAGNOSIS — Z23 Encounter for immunization: Secondary | ICD-10-CM | POA: Diagnosis not present

## 2017-11-09 DIAGNOSIS — F411 Generalized anxiety disorder: Secondary | ICD-10-CM

## 2017-11-09 MED ORDER — SERTRALINE HCL 100 MG PO TABS: 100.0000 mg | ORAL_TABLET | Freq: Every day | ORAL | 6 refills | Status: DC

## 2017-11-09 NOTE — Progress Notes (Signed)
   Subjective:    Patient ID: Donald [name], male    DOB: [birth year], 32 y.o.   MRN: [id number]  Anxiety  Presents for follow-up visit. Patient reports no chest pain, nausea or shortness of breath. The quality of sleep is good.    Pt states in the past week he has became more anxious.  Generalized anxiety disorder feeling very stressed out at times denies high fever chills sweats.  States some issues with taking care of foster children does stress him but he is doing the best he can denies being depressed he is interested in trying a higher dose of the medicine and states he will give [place] feedback on how it is doing   Review of Systems  Constitutional: Negative for activity change, fatigue and fever.  HENT: Negative for congestion and rhinorrhea.   Respiratory: Negative for cough and shortness of breath.   Cardiovascular: Negative for chest pain and leg swelling.  Gastrointestinal: Negative for abdominal pain, diarrhea and nausea.  Genitourinary: Negative for dysuria and hematuria.  Neurological: Negative for weakness and headaches.  Psychiatric/Behavioral: Negative for agitation and behavioral problems.       Objective:   Physical Exam  Constitutional: He appears well-developed and well-nourished. No distress.  HENT:  Head: Normocephalic.  Cardiovascular: Normal rate, regular rhythm and normal heart sounds.  No murmur heard. Pulmonary/Chest: Effort normal and breath sounds normal.  Neurological: He is alert.  Skin: Skin is warm and dry.  Psychiatric: He has a normal mood and affect. His behavior is normal.     15 minutes was spent with patient today discussing healthcare issues which they came.  More than 50% of this visit-total duration of visit-was spent in counseling and coordination of care.  Please see diagnosis regarding the focus of this coordination and care      Assessment & Plan:  Generalized anxiety disorder Patient agrees to trying increased dose of  sertraline He will let us know if there is any problems He will follow-up within 6 months Patient denies being depressed Patient was told that if he has has progressive symptoms or other problems to follow-up immediately he understands this

## 2017-11-18 ENCOUNTER — Other Ambulatory Visit: Payer: Self-pay | Admitting: Family Medicine

## 2017-11-18 NOTE — Telephone Encounter (Signed)
Unfortunately the pharmacies are run by computers We changed his dosage to 100 mg And gave him refills the last time he was here Please notify the pharmacy of this

## 2017-12-15 ENCOUNTER — Encounter: Payer: Self-pay | Admitting: Family Medicine

## 2017-12-15 ENCOUNTER — Ambulatory Visit (INDEPENDENT_AMBULATORY_CARE_PROVIDER_SITE_OTHER): Payer: Commercial Managed Care - PPO | Admitting: Family Medicine

## 2017-12-15 VITALS — BP 138/86 | Ht 75.0 in | Wt 347.2 lb

## 2017-12-15 DIAGNOSIS — M778 Other enthesopathies, not elsewhere classified: Secondary | ICD-10-CM | POA: Diagnosis not present

## 2017-12-15 MED ORDER — DICLOFENAC SODIUM 75 MG PO TBEC
75.0000 mg | DELAYED_RELEASE_TABLET | Freq: Two times a day (BID) | ORAL | 0 refills | Status: AC
Start: 2017-12-15 — End: 2017-12-25

## 2017-12-15 NOTE — Patient Instructions (Signed)
Recommend using wrist brace with velcro straps.  RICE for Routine Care of Injuries Many injuries can be cared for using rest, ice, compression, and elevation (RICE therapy). Using RICE therapy can help to lessen pain and swelling. It can help your body to heal. Rest Reduce your normal activities and avoid using the injured part of your body. You can go back to your normal activities when you feel okay and your doctor says it is okay. Ice Do not put ice on your bare skin.  Put ice in a plastic bag.  Place a towel between your skin and the bag.  Leave the ice on for 20 minutes, 2-3 times a day.  Do this for as long as told by your doctor. Compression Compression means putting pressure on the injured area. This can be done with an elastic bandage. If an elastic bandage has been applied:  Remove and reapply the bandage every 3-4 hours or as told by your doctor.  Make sure the bandage is not wrapped too tight. Wrap the bandage more loosely if part of your body beyond the bandage is blue, swollen, cold, painful, or loses feeling (numb).  See your doctor if the bandage seems to make your problems worse.  Elevation Elevation means keeping the injured area raised. Raise the injured area above your heart or the center of your chest if you can. When should I get help? You should get help if:  You keep having pain and swelling.  Your symptoms get worse.  Get help right away if: You should get help right away if:  You have sudden bad pain at or below the area of your injury.  You have redness or more swelling around your injury.  You have tingling or numbness at or below the injury that does not go away when you take off the bandage.  This information is not intended to replace advice given to you by your health care provider. Make sure you discuss any questions you have with your health care provider. Document Released: 06/17/2007 Document Revised: 11/26/2015 Document Reviewed:  12/06/2013 Elsevier Interactive Patient Education  2017 [employer]

## 2017-12-15 NOTE — Progress Notes (Signed)
   Subjective:    Patient ID: Donald [name], male    DOB: 1985-08-19, 32 y.o.   MRN: [id number]  HPI  Patient arrives with left wrist pain for 2 weeks. Patient recall no injury. Patient has not tried anything to help and thought it would fade out but it seems to be getting worse.  Reports left wrist pain x 2 weeks, getting worse over last couple weeks. Pain wakes him up at night. Unable to fully rotate wrist. No injury. No pain when keeping wrist straight.   Review of Systems  Constitutional: Negative for fever and unexpected weight change.  Musculoskeletal: Positive for arthralgias. Negative for joint swelling.  Skin: Negative for color change.       Objective:   Physical Exam  Constitutional: He is oriented to person, place, and time. He appears well-developed and well-nourished. No distress.  HENT:  Head: Normocephalic and atraumatic.  Neck: Neck supple.  Cardiovascular: Normal rate, regular rhythm and normal heart sounds.  No murmur heard. Pulmonary/Chest: Effort normal and breath sounds normal. No respiratory distress.  Musculoskeletal:       Left wrist: He exhibits decreased range of motion and tenderness. He exhibits no swelling and no deformity.  Left wrist with tenderness to ulnar side. Pain with supination, unable to fully supinate. Pain with flexion/extension and inversion/eversion. Strength and sensation intact. Radial pulse 2+. No swelling or deformity noted.  Neurological: He is alert and oriented to person, place, and time.  Skin: Skin is warm and dry.  Psychiatric: He has a normal mood and affect.  Nursing note and vitals reviewed.         Assessment & Plan:  Left wrist tendonitis Strongly feel this is a tenderness of the wrist.  X-rays are not indicated at this time.  Recommend the use of a wrist brace as well as RICE measures.  Recommend anti-inflammatory medication for the next 10 days.  Warning signs discussed.  He should follow-up in 10 days, if  symptoms worsen or fail to improve he should let us know at that time may need x-ray.  Dr. Lilyan PuntScott Luking was consulted on this case and is in agreement with the above treatment plan.

## 2017-12-27 ENCOUNTER — Encounter: Payer: Self-pay | Admitting: Family Medicine

## 2017-12-27 ENCOUNTER — Ambulatory Visit (INDEPENDENT_AMBULATORY_CARE_PROVIDER_SITE_OTHER): Payer: Commercial Managed Care - PPO | Admitting: Family Medicine

## 2017-12-27 VITALS — BP 132/90 | Ht 75.0 in | Wt 342.0 lb

## 2017-12-27 DIAGNOSIS — K219 Gastro-esophageal reflux disease without esophagitis: Secondary | ICD-10-CM

## 2017-12-27 DIAGNOSIS — M778 Other enthesopathies, not elsewhere classified: Secondary | ICD-10-CM

## 2017-12-27 MED ORDER — PANTOPRAZOLE SODIUM 40 MG PO TBEC: 40.0000 mg | DELAYED_RELEASE_TABLET | Freq: Every day | ORAL | 2 refills | Status: DC

## 2017-12-27 MED ORDER — DICLOFENAC SODIUM 75 MG PO TBEC: 75.0000 mg | DELAYED_RELEASE_TABLET | Freq: Two times a day (BID) | ORAL | 0 refills | Status: AC

## 2017-12-27 NOTE — Patient Instructions (Signed)
Food Choices for Gastroesophageal Reflux Disease, Adult When you have gastroesophageal reflux disease (GERD), the foods you eat and your eating habits are very important. Choosing the right foods can help ease your discomfort. What guidelines do I need to follow?  Choose fruits, vegetables, whole grains, and low-fat dairy products.  Choose low-fat meat, fish, and poultry.  Limit fats such as oils, salad dressings, butter, nuts, and avocado.  Keep a food diary. This helps you identify foods that cause symptoms.  Avoid foods that cause symptoms. These may be different for everyone.  Eat small meals often instead of 3 large meals a day.  Eat your meals slowly, in a place where you are relaxed.  Limit fried foods.  Cook foods using methods other than frying.  Avoid drinking alcohol.  Avoid drinking large amounts of liquids with your meals.  Avoid bending over or lying down until 2-3 hours after eating. What foods are not recommended? These are some foods and drinks that may make your symptoms worse: Vegetables  Tomatoes. Tomato juice. Tomato and spaghetti sauce. Chili peppers. Onion and garlic. Horseradish. Fruits  Oranges, grapefruit, and lemon (fruit and juice). Meats  High-fat meats, fish, and poultry. This includes hot dogs, ribs, ham, sausage, salami, and bacon. Dairy  Whole milk and chocolate milk. Sour cream. Cream. Butter. Ice cream. Cream cheese. Drinks  Coffee and tea. Bubbly (carbonated) drinks or energy drinks. Condiments  Hot sauce. Barbecue sauce. Sweets/Desserts  Chocolate and cocoa. Donuts. Peppermint and spearmint. Fats and Oils  High-fat foods. This includes French fries and potato chips. Other  Vinegar. Strong spices. This includes black pepper, white pepper, red pepper, cayenne, curry powder, cloves, ginger, and chili powder. The items listed above may not be a complete list of foods and drinks to avoid. Contact your dietitian for more information.    This information is not intended to replace advice given to you by your health care provider. Make sure you discuss any questions you have with your health care provider. Document Released: 06/30/2011 Document Revised: 06/06/2015 Document Reviewed: 11/02/2012 Elsevier Interactive Patient Education  2017 [employer]vier Inc.  

## 2017-12-27 NOTE — Progress Notes (Signed)
   Subjective:    Patient ID: Donald [name], male    DOB: Apr 22, 1985, 32 y.o.   MRN: [id number]  HPI Patient is here today to follow up on Left wrist pain.Patient states he did not injury it, it had just started hurting for no reason. He was given given Voltaren 75 mg one po Bid for 10 days, and wear a brace.He says this has help very little.He reports the pain level is better,but still there.  Reports pain has improved, states what helped the most was when his son pulled his wrist and twisted it and it popped about 1 week ago.  Pain worse with supination and inversion of left wrist or when lifting things, states 8/10. When not in use has no pain.   Pt reports hx of burning in his stomach and reflux symptoms, worse at night after taking his zoloft. Reports reflux has been increased since taking diclofenac.  Review of Systems  Musculoskeletal: Negative for joint swelling.  Neurological: Negative for numbness.       Objective:   Physical Exam Vitals signs and nursing note reviewed.  Constitutional:      General: He is not in acute distress.    Appearance: He is well-developed.  HENT:     Head: Normocephalic and atraumatic.  Neck:     Musculoskeletal: Neck supple.  Cardiovascular:     Rate and Rhythm: Normal rate and regular rhythm.     Heart sounds: Normal heart sounds. No murmur.  Pulmonary:     Effort: Pulmonary effort is normal. No respiratory distress.     Breath sounds: Normal breath sounds.  Musculoskeletal:     Left wrist: He exhibits normal range of motion, no swelling and no deformity.     Comments: Left wrist with tenderness over the ulnar aspect. Strength intact. Full ROM. Pain with resistance to ROM and with supination and inversion.   Skin:    General: Skin is warm and dry.  Neurological:     Mental Status: He is alert and oriented to person, place, and time.       Assessment & Plan:  1. Left wrist tendonitis Discussed continuing diclofenac x 2 weeks for  tendonitis, improvement is reassuring. No need for any imaging studies today. Recommend if continued pain after 2 weeks to call us and we will submit referral to ortho at that time.  2. Gastroesophageal reflux disease without esophagitis Will rx trial of protonix to take for the next 3 months. If symptoms worsen or fail to improve he should f/u. Discussed increased risk of ulcer development associated with taking NSAID and SSRI concurrently and educated on warning signs. Pt verbalized understanding.  Dr. Lilyan PuntScott Luking was consulted on this case and is in agreement with the above treatment plan.

## 2018-02-21 ENCOUNTER — Encounter: Payer: Self-pay | Admitting: Family Medicine

## 2018-02-21 ENCOUNTER — Ambulatory Visit (INDEPENDENT_AMBULATORY_CARE_PROVIDER_SITE_OTHER): Payer: Commercial Managed Care - PPO | Admitting: Family Medicine

## 2018-02-21 VITALS — Temp 98.1°F | Ht 75.0 in | Wt 334.8 lb

## 2018-02-21 DIAGNOSIS — R197 Diarrhea, unspecified: Secondary | ICD-10-CM | POA: Diagnosis not present

## 2018-02-21 LAB — POCT HEMOGLOBIN: Hemoglobin: 15 g/dL — AB (ref 11–14.6)

## 2018-02-21 MED ORDER — SUCRALFATE 1 GM/10ML PO SUSP: 1.0000 g | Freq: Three times a day (TID) | ORAL | 0 refills | Status: DC

## 2018-02-21 NOTE — Progress Notes (Signed)
   Subjective:    Patient ID: Donald [name], male    DOB: November 05, 1985, 33 y.o.   MRN: [id number]  HPI  Patient arrives with diarrhea since yesterday. Patient states the diarrhea looks dark and grainy.  Started off with loose stools then progressed into having some abdominal cramps then black stools with coffee-ground-like appearances denies any NSAID use.  Does have history of gastritis is on acid blockers  Review of Systems  Constitutional: Negative for activity change, chills and fever.  HENT: Negative for congestion, ear pain and rhinorrhea.   Eyes: Negative for discharge.  Respiratory: Negative for cough and wheezing.   Cardiovascular: Negative for chest pain.  Gastrointestinal: Positive for diarrhea. Negative for nausea and vomiting.  Musculoskeletal: Negative for arthralgias.   Results for orders placed or performed in visit on 02/21/18  POCT hemoglobin  Result Value Ref Range   Hemoglobin 15.0 (A) 11 - 14.6 g/dL       Objective:   Physical Exam Vitals signs and nursing note reviewed.  Constitutional:      Appearance: He is well-developed.  HENT:     Head: Normocephalic.     Mouth/Throat:     Pharynx: No oropharyngeal exudate.  Neck:     Musculoskeletal: Normal range of motion.  Cardiovascular:     Rate and Rhythm: Normal rate and regular rhythm.     Heart sounds: Normal heart sounds. No murmur.  Pulmonary:     Effort: Pulmonary effort is normal.     Breath sounds: Normal breath sounds. No wheezing.  Abdominal:     General: There is no distension.     Palpations: Abdomen is soft.     Tenderness: There is no abdominal tenderness.     Hernia: No hernia is present.  Lymphadenopathy:     Cervical: No cervical adenopathy.  Skin:    General: Skin is warm and dry.  Neurological:     Motor: No abnormal muscle tone.    Warning signs regarding excessive bleeding was discussed go to ER if problems       Assessment & Plan:  History of gastritis Takes his  pantoprazole on a daily basis Add Carafate Consultation with gastroenterology Avoid all NSAIDs anti-inflammatories   This started off more so is a diarrhea spell which quickly then triggered bloody stools in the form of black coffee grounds.  Hemoglobin is stable patient hemodynamically stable he was told that if this gets progressively worse to reach out to [place] to let us know otherwise urgent referral to gastroenterology

## 2018-02-25 ENCOUNTER — Encounter: Payer: Self-pay | Admitting: Gastroenterology

## 2018-02-25 ENCOUNTER — Telehealth: Payer: Self-pay

## 2018-02-25 ENCOUNTER — Other Ambulatory Visit: Payer: Self-pay

## 2018-02-25 ENCOUNTER — Ambulatory Visit (INDEPENDENT_AMBULATORY_CARE_PROVIDER_SITE_OTHER): Payer: Commercial Managed Care - PPO | Admitting: Gastroenterology

## 2018-02-25 DIAGNOSIS — K921 Melena: Secondary | ICD-10-CM | POA: Insufficient documentation

## 2018-02-25 NOTE — Progress Notes (Signed)
Primary Care Physician:  Babs Sciara, MD Primary Gastroenterologist:  Dr. Jena Gauss   Chief Complaint  Patient presents with  . Abdominal Cramping  . Diarrhea    has now stopped  . Rectal Bleeding    HPI:   Donald [name] is a 33 y.o. male presenting today at the request of Dr. Gerda Diss secondary to melena and abdominal cramping. He had an EGD in 2016 with small hiatal hernia, gastric polyps benign, negative H.pylori.   Saturday prior to symptoms starting, chest was hurting. Sunday night had liquid diarrhea. Monday morning woke up and had pitch black stool X 2. Went to work and looked like like little coffee grounds. Diarrhea up till yesterday. Stool is now soft. Still with what looks like specks of black. Was having 6-7 loose stools. Some abdominal cramping in upper abdomen/sides. No N/V. No dysphagia. No NSAIDs. Takes Protonix daily, which was started a few months ago. Prior to this was vomiting every evening. Diclofenac BID for about 14 days in December for tendonitis without PPI, then started Protonix.     Past Medical History:  Diagnosis Date  . Anxiety   . Depression   . Dyspepsia   . Morbid obesity (HCC) 05/10/2017  . Pinched nerve    in his back    Past Surgical History:  Procedure Laterality Date  . APPENDECTOMY     July 2019  . ESOPHAGOGASTRODUODENOSCOPY N/A 04/02/2014   RMR: Very small hiatial hernia. Gastric polyps status post biopsy. No endoscopic explanation for patient's symptoms. Bengin fundic gland polyp. Negative H.pylori    Current Outpatient Medications  Medication Sig Dispense Refill  . ALPRAZolam (XANAX) 0.5 MG tablet 1/2 to 1 bid prn panic attacks 24 tablet 0  . chlorzoxazone (PARAFON FORTE DSC) 500 MG tablet Take 1 tablet (500 mg total) by mouth 3 (three) times daily as needed for muscle spasms. 20 tablet 0  . pantoprazole (PROTONIX) 40 MG tablet Take 1 tablet (40 mg total) by mouth daily. 30 tablet 2  . sertraline (ZOLOFT) 100 MG tablet Take 1  tablet (100 mg total) by mouth daily. 30 tablet 6  . sucralfate (CARAFATE) 1 GM/10ML suspension Take 10 mLs (1 g total) by mouth 4 (four) times daily -  with meals and at bedtime. 420 mL 0   No current facility-administered medications for this visit.     Allergies as of 02/25/2018  . (No Known Allergies)    Family History  Problem Relation Age of Onset  . Diabetes Father   . Hypertension Father   . Colon cancer Neg Hx   . Stomach cancer Neg Hx   . Colon polyps Neg Hx     Social History   Socioeconomic History  . Marital status: Married    Spouse name: Not on file  . Number of children: 2  . Years of education: Not on file  . Highest education level: Not on file  Occupational History  . 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 Financial resource strain: Not on file  . Food insecurity:    Worry: Not on file    Inability: Not on file  . Transportation needs:    Medical: Not on file    Non-medical: Not on file  Tobacco Use  . Smoking status: Never Smoker  . Smokeless tobacco: Never Used  Substance and Sexual Activity  . Alcohol use: Not Currently    Alcohol/week: 0.0 standard drinks  Comment: rarely, "once in a blue moon"  . Drug use: No  . Sexual activity: Not on file  Lifestyle  . Physical activity:    Days per week: Not on file    Minutes per session: Not on file  . Stress: Not on file  Relationships  . Social connections:    Talks on phone: Not on file    Gets together: Not on file    Attends religious service: Not on file    Active member of club or organization: Not on file    Attends meetings of clubs or organizations: Not on file    Relationship status: Not on file  . Intimate partner violence:    Fear of current or ex partner: Not on file    Emotionally abused: Not on file    Physically abused: Not on file    Forced sexual activity: Not on file  Other Topics Concern  . Not on file  Social History Narrative   As of  2020: 2 adopted children, both boys.     Review of Systems: Gen: Denies any fever, chills, fatigue, weight loss, lack of appetite.  CV: Denies chest pain, heart palpitations, peripheral edema, syncope.  Resp: Denies shortness of breath at rest or with exertion. Denies wheezing or cough.  GI: see HPI GU : Denies urinary burning, urinary frequency, urinary hesitancy MS: Denies joint pain, muscle weakness, cramps, or limitation of movement.  Derm: Denies rash, itching, dry skin Psych: Denies depression, anxiety, memory loss, and confusion Heme: Denies bruising, bleeding, and enlarged lymph nodes.  Physical Exam: BP (!) 142/88   Pulse 78   Temp (!) 96.9 F (36.1 C) (Oral)   Ht 6' 3" (1.905 m)   Wt (!) 333 lb (151 kg)   BMI 41.62 kg/m  General:   Alert and oriented. Pleasant and cooperative. Well-nourished and well-developed.  Head:  Normocephalic and atraumatic. Eyes:  Without icterus, sclera clear and conjunctiva pink.  Ears:  Normal auditory acuity. Nose:  No deformity, discharge,  or lesions. Mouth:  No deformity or lesions, oral mucosa pink.  Lungs:  Clear to auscultation bilaterally. No wheezes, rales, or rhonchi. No distress.  Heart:  S1, S2 present without murmurs appreciated.  Abdomen:  +BS, soft, non-tender and non-distended. No HSM noted. No guarding or rebound. No masses appreciated.  Rectal:  Deferred  Msk:  Symmetrical without gross deformities. Normal posture. Extremities:  Without edema. Neurologic:  Alert and  oriented x4 Psych:  Alert and cooperative. Normal mood and affect.  Lab Results  Component Value Date   HGB 15.0 (A) 02/21/2018    

## 2018-02-25 NOTE — Assessment & Plan Note (Addendum)
Pleasant 33 year old male with reports of melena earlier this week, now resolved. Diarrhea and abdominal pain also resolved. No N/V, dysphagia. Denies chronic NSAIDs but had been on a course of Voltaren in Dec 2019 and then starting PPI thereafter due to N/V. Clinically stable, vitals stable, Hgb 15, and likely dealing with erosions, esophagitis, doubt PUD. Due to reports of melena, will pursue EGD in near future with Dr. Jena Gauss. Needs to remain on PPI daily on empty stomach for best absorption. Call if further melena.   Proceed with upper endoscopy in the near future with Dr. Jena Gauss. The risks, benefits, and alternatives have been discussed in detail with patient. They have stated understanding and desire to proceed.  Propofol due to polypharmacy Continue PPI Continue to avoid NSAIDs  Return in 3 months

## 2018-02-25 NOTE — Telephone Encounter (Signed)
Called [facility] for PA of EGD. Case approved. PA# [id number].

## 2018-02-25 NOTE — Patient Instructions (Signed)
Donald CrumbleBrandon M Bankert  02/25/2018     @PREFPERIOPPHARMACY @   Your procedure is scheduled on  03/03/2018.  [facility] to [facility] at  1100  A.M.  Call this number if you have problems the morning of surgery:  [phone]   Remember:  Follow the diet and prep instructions given to you by Dr Luvenia Starchourk's office.                    Take these medicines the morning of surgery with A SIP OF WATER  Xanax, parafon forte, protonix, zoloft.    Do not wear jewelry, make-up or nail polish.  Do not wear lotions, powders, or perfumes, or deodorant.  Do not shave 48 hours prior to surgery.  Men may shave face and neck.  Do not bring valuables to the hospital.  [facility] belongings or valuables.  Contacts, dentures or bridgework may not be worn into surgery.  Leave your suitcase in the car.  After surgery it may be brought to your room.  For patients admitted to the hospital, discharge time will be determined by your treatment team.  Patients discharged the day of surgery will not be allowed to drive home.   Name and phone number of your driver:   family Special instructions:  None  Please read over the following fact sheets that you were given. Anesthesia Post-op Instructions and Care and Recovery After Surgery       Upper Endoscopy, Adult Upper endoscopy is a procedure to look inside the upper GI (gastrointestinal) tract. The upper GI tract is made up of:  The part of the body that moves food from your mouth to your stomach (esophagus).  The stomach.  The first part of your small intestine (duodenum). This procedure is also called esophagogastroduodenoscopy (EGD) or gastroscopy. In this procedure, your health care provider passes a thin, flexible tube (endoscope) through your mouth and down your esophagus into your stomach. A small camera is attached to the end of the tube. Images from the camera appear on a monitor in the exam room. During this  procedure, your health care provider may also remove a small piece of tissue to be sent to a lab and examined under a microscope (biopsy). Your health care provider may do an upper endoscopy to diagnose cancers of the upper GI tract. You may also have this procedure to find the cause of other conditions, such as:  Stomach pain.  Heartburn.  Pain or problems when swallowing.  Nausea and vomiting.  Stomach bleeding.  Stomach ulcers. Tell a health care provider about:  Any allergies you have.  All medicines you are taking, including vitamins, herbs, eye drops, creams, and over-the-counter medicines.  Any problems you or family members have had with anesthetic medicines.  Any blood disorders you have.  Any surgeries you have had.  Any medical conditions you have.  Whether you are pregnant or may be pregnant. What are the risks? Generally, this is a safe procedure. However, problems may occur, including:  Infection.  Bleeding.  Allergic reactions to medicines.  A tear or hole (perforation) in the esophagus, stomach, or duodenum. What happens before the procedure? Staying hydrated Follow instructions from your health care provider about hydration, which may include:  Up to 2 hours before the procedure - you may continue to drink clear liquids, such as water, clear fruit juice, black coffee, and plain tea.  Eating and drinking restrictions Follow instructions from your health care provider about eating and drinking, which may include:  8 hours before the procedure - stop eating heavy meals or foods, such as meat, fried foods, or fatty foods.  6 hours before the procedure - stop eating light meals or foods, such as toast or cereal.  6 hours before the procedure - stop drinking milk or drinks that contain milk.  2 hours before the procedure - stop drinking clear liquids. Medicines Ask your health care provider about:  Changing or stopping your regular medicines. This  is especially important if you are taking diabetes medicines or blood thinners.  Taking medicines such as aspirin and ibuprofen. These medicines can thin your blood. Do not take these medicines unless your health care provider tells you to take them.  Taking over-the-counter medicines, vitamins, herbs, and supplements. General instructions  Plan to have someone take you home from the hospital or clinic.  If you will be going home right after the procedure, plan to have someone with you for 24 hours.  Ask your health care provider what steps will be taken to help prevent infection. What happens during the procedure?   An IV will be inserted into one of your veins.  You may be given one or more of the following: ? A medicine to help you relax (sedative). ? A medicine to numb the throat (local anesthetic).  You will lie on your left side on an exam table.  Your health care provider will pass the endoscope through your mouth and down your esophagus.  Your health care provider will use the scope to check the inside of your esophagus, stomach, and duodenum. Biopsies may be taken.  The endoscope will be removed. The procedure may vary among health care providers and hospitals. What happens after the procedure?  Your blood pressure, heart rate, breathing rate, and blood oxygen level will be monitored until you leave the hospital or clinic.  Do not drive for 24 hours if you were given a sedative during your procedure.  When your throat is no longer numb, you may be given some fluids to drink.  It is up to you to get the results of your procedure. Ask your health care provider, or the department that is doing the procedure, when your results will be ready. Summary  Upper endoscopy is a procedure to look inside the upper GI tract.  During the procedure, an IV will be inserted into one of your veins. You may be given a medicine to help you relax.  A medicine will be used to numb your  throat.  The endoscope will be passed through your mouth and down your esophagus. This information is not intended to replace advice given to you by your health care provider. Make sure you discuss any questions you have with your health care provider. Document Released: 12/27/1999 Document Revised: 05/31/2017 Document Reviewed: 05/31/2017 Elsevier Interactive Patient Education  2019 Elsevier Inc.  Upper Endoscopy, Adult, Care After This sheet gives you information about how to care for yourself after your procedure. Your health care provider may also give you more specific instructions. If you have problems or questions, contact your health care provider. What can I expect after the procedure? After the procedure, it is common to have:  A sore throat.  Mild stomach pain or discomfort.  Bloating.  Nausea. Follow these instructions at home:   Follow instructions from your health care provider about what to eat or drink  after your procedure.  Return to your normal activities as told by your health care provider. Ask your health care provider what activities are safe for you.  Take over-the-counter and prescription medicines only as told by your health care provider.  Do not drive for 24 hours if you were given a sedative during your procedure.  Keep all follow-up visits as told by your health care provider. This is important. Contact a health care provider if you have:  A sore throat that lasts longer than one day.  Trouble swallowing. Get help right away if:  You vomit blood or your vomit looks like coffee grounds.  You have: ? A fever. ? Bloody, black, or tarry stools. ? A severe sore throat or you cannot swallow. ? Difficulty breathing. ? Severe pain in your chest or abdomen. Summary  After the procedure, it is common to have a sore throat, mild stomach discomfort, bloating, and nausea.  Do not drive for 24 hours if you were given a sedative during the  procedure.  Follow instructions from your health care provider about what to eat or drink after your procedure.  Return to your normal activities as told by your health care provider. This information is not intended to replace advice given to you by your health care provider. Make sure you discuss any questions you have with your health care provider. Document Released: 06/30/2011 Document Revised: 05/31/2017 Document Reviewed: 05/31/2017 Elsevier Interactive Patient Education  2019 Elsevier Inc.  Monitored Anesthesia Care Anesthesia is a term that refers to techniques, procedures, and medicines that help a person stay safe and comfortable during a medical procedure. Monitored anesthesia care, or sedation, is one type of anesthesia. Your anesthesia specialist may recommend sedation if you will be having a procedure that does not require you to be unconscious, such as:  Cataract surgery.  A dental procedure.  A biopsy.  A colonoscopy. During the procedure, you may receive a medicine to help you relax (sedative). There are three levels of sedation:  Mild sedation. At this level, you may feel awake and relaxed. You will be able to follow directions.  Moderate sedation. At this level, you will be sleepy. You may not remember the procedure.  Deep sedation. At this level, you will be asleep. You will not remember the procedure. The more medicine you are given, the deeper your level of sedation will be. Depending on how you respond to the procedure, the anesthesia specialist may change your level of sedation or the type of anesthesia to fit your needs. An anesthesia specialist will monitor you closely during the procedure. Let your health care provider know about:  Any allergies you have.  All medicines you are taking, including vitamins, herbs, eye drops, creams, and over-the-counter medicines.  Any use of steroids (by mouth or as a cream).  Any problems you or family members have had  with sedatives and anesthetic medicines.  Any blood disorders you have.  Any surgeries you have had.  Any medical conditions you have, such as sleep apnea.  Whether you are pregnant or may be pregnant.  Any use of cigarettes, alcohol, or street drugs. What are the risks? Generally, this is a safe procedure. However, problems may occur, including:  Getting too much medicine (oversedation).  Nausea.  Allergic reaction to medicines.  Trouble breathing. If this happens, a breathing tube may be used to help with breathing. It will be removed when you are awake and breathing on your own.  Heart trouble.  Lung trouble. Before the procedure Staying hydrated Follow instructions from your health care provider about hydration, which may include:  Up to 2 hours before the procedure - you may continue to drink clear liquids, such as water, clear fruit juice, black coffee, and plain tea. Eating and drinking restrictions Follow instructions from your health care provider about eating and drinking, which may include:  8 hours before the procedure - stop eating heavy meals or foods such as meat, fried foods, or fatty foods.  6 hours before the procedure - stop eating light meals or foods, such as toast or cereal.  6 hours before the procedure - stop drinking milk or drinks that contain milk.  2 hours before the procedure - stop drinking clear liquids. Medicines Ask your health care provider about:  Changing or stopping your regular medicines. This is especially important if you are taking diabetes medicines or blood thinners.  Taking medicines such as aspirin and ibuprofen. These medicines can thin your blood. Do not take these medicines before your procedure if your health care provider instructs you not to. Tests and exams  You will have a physical exam.  You may have blood tests done to show: ? How well your kidneys and liver are working. ? How well your blood can clot. General  instructions  Plan to have someone take you home from the hospital or clinic.  If you will be going home right after the procedure, plan to have someone with you for 24 hours.  What happens during the procedure?  Your blood pressure, heart rate, breathing, level of pain and overall condition will be monitored.  An IV tube will be inserted into one of your veins.  Your anesthesia specialist will give you medicines as needed to keep you comfortable during the procedure. This may mean changing the level of sedation.  The procedure will be performed. After the procedure  Your blood pressure, heart rate, breathing rate, and blood oxygen level will be monitored until the medicines you were given have worn off.  Do not drive for 24 hours if you received a sedative.  You may: ? Feel sleepy, clumsy, or nauseous. ? Feel forgetful about what happened after the procedure. ? Have a sore throat if you had a breathing tube during the procedure. ? Vomit. This information is not intended to replace advice given to you by your health care provider. Make sure you discuss any questions you have with your health care provider. Document Released: 09/24/2004 Document Revised: 06/07/2015 Document Reviewed: 04/21/2015 Elsevier Interactive Patient Education  2019 [employer] Inc. Monitored Anesthesia Care, Care After These instructions provide you with information about caring for yourself after your procedure. Your health care provider may also give you more specific instructions. Your treatment has been planned according to current medical practices, but problems sometimes occur. Call your health care provider if you have any problems or questions after your procedure. What can I expect after the procedure? After your procedure, you may:  Feel sleepy for several hours.  Feel clumsy and have poor balance for several hours.  Feel forgetful about what happened after the procedure.  Have poor judgment for  several hours.  Feel nauseous or vomit.  Have a sore throat if you had a breathing tube during the procedure. Follow these instructions at home: For at least 24 hours after the procedure:      Have a responsible adult stay with you. It is important to have someone help care for you until  you are awake and alert.  Rest as needed.  Do not: ? Participate in activities in which you could fall or become injured. ? Drive. ? Use heavy machinery. ? Drink alcohol. ? Take sleeping pills or medicines that cause drowsiness. ? Make important decisions or sign legal documents. ? Take care of children on your own. Eating and drinking  Follow the diet that is recommended by your health care provider.  If you vomit, drink water, juice, or soup when you can drink without vomiting.  Make sure you have little or no nausea before eating solid foods. General instructions  Take over-the-counter and prescription medicines only as told by your health care provider.  If you have sleep apnea, surgery and certain medicines can increase your risk for breathing problems. Follow instructions from your health care provider about wearing your sleep device: ? Anytime you are sleeping, including during daytime naps. ? While taking prescription pain medicines, sleeping medicines, or medicines that make you drowsy.  If you smoke, do not smoke without supervision.  Keep all follow-up visits as told by your health care provider. This is important. Contact a health care provider if:  You keep feeling nauseous or you keep vomiting.  You feel light-headed.  You develop a rash.  You have a fever. Get help right away if:  You have trouble breathing. Summary  For several hours after your procedure, you may feel sleepy and have poor judgment.  Have a responsible adult stay with you for at least 24 hours or until you are awake and alert. This information is not intended to replace advice given to you by  your health care provider. Make sure you discuss any questions you have with your health care provider. Document Released: 04/21/2015 Document Revised: 08/14/2016 Document Reviewed: 04/21/2015 Elsevier Interactive Patient Education  2019 [employer].

## 2018-02-25 NOTE — Patient Instructions (Addendum)
Make sure to take Protonix on an empty stomach as you are doing.   We are setting you up for an upper endoscopy with Dr. Jena Gauss in the near future.  Further recommendations to follow, and we will see you in follow-up in 3 months!   It was a pleasure to see you today. I strive to create trusting relationships with patients to provide genuine, compassionate, and quality care. I value your feedback. If you receive a survey regarding your visit,  I greatly appreciate you taking time to fill this out.   Gelene Mink, PhD, ANP-BC [facility]

## 2018-02-25 NOTE — Telephone Encounter (Signed)
Called [facility] for PA of EGD. Case approved. PA# [id number]. 

## 2018-02-25 NOTE — H&P (View-Only) (Signed)
Primary Care Physician:  Babs Sciara, MD Primary Gastroenterologist:  Dr. Jena Gauss   Chief Complaint  Patient presents with  . Abdominal Cramping  . Diarrhea    has now stopped  . Rectal Bleeding    HPI:   Donald [name] is a 33 y.o. male presenting today at the request of Dr. Gerda Diss secondary to melena and abdominal cramping. He had an EGD in 2016 with small hiatal hernia, gastric polyps benign, negative H.pylori.   Saturday prior to symptoms starting, chest was hurting. Sunday night had liquid diarrhea. Monday morning woke up and had pitch black stool X 2. Went to work and looked like like little coffee grounds. Diarrhea up till yesterday. Stool is now soft. Still with what looks like specks of black. Was having 6-7 loose stools. Some abdominal cramping in upper abdomen/sides. No N/V. No dysphagia. No NSAIDs. Takes Protonix daily, which was started a few months ago. Prior to this was vomiting every evening. Diclofenac BID for about 14 days in December for tendonitis without PPI, then started Protonix.     Past Medical History:  Diagnosis Date  . Anxiety   . Depression   . Dyspepsia   . Morbid obesity (HCC) 05/10/2017  . Pinched nerve    in his back    Past Surgical History:  Procedure Laterality Date  . APPENDECTOMY     July 2019  . ESOPHAGOGASTRODUODENOSCOPY N/A 04/02/2014   RMR: Very small hiatial hernia. Gastric polyps status post biopsy. No endoscopic explanation for patient's symptoms. Bengin fundic gland polyp. Negative H.pylori    Current Outpatient Medications  Medication Sig Dispense Refill  . ALPRAZolam (XANAX) 0.5 MG tablet 1/2 to 1 bid prn panic attacks 24 tablet 0  . chlorzoxazone (PARAFON FORTE DSC) 500 MG tablet Take 1 tablet (500 mg total) by mouth 3 (three) times daily as needed for muscle spasms. 20 tablet 0  . pantoprazole (PROTONIX) 40 MG tablet Take 1 tablet (40 mg total) by mouth daily. 30 tablet 2  . sertraline (ZOLOFT) 100 MG tablet Take 1  tablet (100 mg total) by mouth daily. 30 tablet 6  . sucralfate (CARAFATE) 1 GM/10ML suspension Take 10 mLs (1 g total) by mouth 4 (four) times daily -  with meals and at bedtime. 420 mL 0   No current facility-administered medications for this visit.     Allergies as of 02/25/2018  . (No Known Allergies)    Family History  Problem Relation Age of Onset  . Diabetes Father   . Hypertension Father   . Colon cancer Neg Hx   . Stomach cancer Neg Hx   . Colon polyps Neg Hx     Social History   Socioeconomic History  . Marital status: Married    Spouse name: Not on file  . Number of children: 2  . Years of education: Not on file  . Highest education level: Not on file  Occupational History  . 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 Financial resource strain: Not on file  . Food insecurity:    Worry: Not on file    Inability: Not on file  . Transportation needs:    Medical: Not on file    Non-medical: Not on file  Tobacco Use  . Smoking status: Never Smoker  . Smokeless tobacco: Never Used  Substance and Sexual Activity  . Alcohol use: Not Currently    Alcohol/week: 0.0 standard drinks  Comment: rarely, "once in a blue moon"  . Drug use: No  . Sexual activity: Not on file  Lifestyle  . Physical activity:    Days per week: Not on file    Minutes per session: Not on file  . Stress: Not on file  Relationships  . Social connections:    Talks on phone: Not on file    Gets together: Not on file    Attends religious service: Not on file    Active member of club or organization: Not on file    Attends meetings of clubs or organizations: Not on file    Relationship status: Not on file  . Intimate partner violence:    Fear of current or ex partner: Not on file    Emotionally abused: Not on file    Physically abused: Not on file    Forced sexual activity: Not on file  Other Topics Concern  . Not on file  Social History Narrative   As of  2020: 2 adopted children, both boys.     Review of Systems: Gen: Denies any fever, chills, fatigue, weight loss, lack of appetite.  CV: Denies chest pain, heart palpitations, peripheral edema, syncope.  Resp: Denies shortness of breath at rest or with exertion. Denies wheezing or cough.  GI: see HPI GU : Denies urinary burning, urinary frequency, urinary hesitancy MS: Denies joint pain, muscle weakness, cramps, or limitation of movement.  Derm: Denies rash, itching, dry skin Psych: Denies depression, anxiety, memory loss, and confusion Heme: Denies bruising, bleeding, and enlarged lymph nodes.  Physical Exam: BP (!) 142/88   Pulse 78   Temp (!) 96.9 F (36.1 C) (Oral)   Ht 6\' 3"  (1.905 m)   Wt (!) 333 lb (151 kg)   BMI 41.62 kg/m  General:   Alert and oriented. Pleasant and cooperative. Well-nourished and well-developed.  Head:  Normocephalic and atraumatic. Eyes:  Without icterus, sclera clear and conjunctiva pink.  Ears:  Normal auditory acuity. Nose:  No deformity, discharge,  or lesions. Mouth:  No deformity or lesions, oral mucosa pink.  Lungs:  Clear to auscultation bilaterally. No wheezes, rales, or rhonchi. No distress.  Heart:  S1, S2 present without murmurs appreciated.  Abdomen:  +BS, soft, non-tender and non-distended. No HSM noted. No guarding or rebound. No masses appreciated.  Rectal:  Deferred  Msk:  Symmetrical without gross deformities. Normal posture. Extremities:  Without edema. Neurologic:  Alert and  oriented x4 Psych:  Alert and cooperative. Normal mood and affect.  Lab Results  Component Value Date   HGB 15.0 (A) 02/21/2018

## 2018-02-28 ENCOUNTER — Other Ambulatory Visit: Payer: Self-pay

## 2018-02-28 ENCOUNTER — Encounter (HOSPITAL_COMMUNITY): Payer: Self-pay

## 2018-02-28 ENCOUNTER — Telehealth: Payer: Self-pay | Admitting: Internal Medicine

## 2018-02-28 ENCOUNTER — Encounter (HOSPITAL_COMMUNITY)
Admission: RE | Admit: 2018-02-28 | Discharge: 2018-02-28 | Disposition: A | Discharge status: Home / Self Care | Payer: Commercial Managed Care - PPO | Source: Ambulatory Visit | Attending: Internal Medicine | Admitting: Internal Medicine

## 2018-02-28 DIAGNOSIS — K921 Melena: Secondary | ICD-10-CM | POA: Diagnosis present

## 2018-02-28 DIAGNOSIS — Z01812 Encounter for preprocedural laboratory examination: Secondary | ICD-10-CM | POA: Insufficient documentation

## 2018-02-28 DIAGNOSIS — K317 Polyp of stomach and duodenum: Secondary | ICD-10-CM | POA: Diagnosis not present

## 2018-02-28 DIAGNOSIS — F329 Major depressive disorder, single episode, unspecified: Secondary | ICD-10-CM | POA: Diagnosis not present

## 2018-02-28 DIAGNOSIS — F419 Anxiety disorder, unspecified: Secondary | ICD-10-CM | POA: Diagnosis not present

## 2018-02-28 DIAGNOSIS — Z6841 Body Mass Index (BMI) 40.0 and over, adult: Secondary | ICD-10-CM | POA: Diagnosis not present

## 2018-02-28 DIAGNOSIS — K449 Diaphragmatic hernia without obstruction or gangrene: Secondary | ICD-10-CM | POA: Diagnosis not present

## 2018-02-28 DIAGNOSIS — Z833 Family history of diabetes mellitus: Secondary | ICD-10-CM | POA: Diagnosis not present

## 2018-02-28 DIAGNOSIS — Z8249 Family history of ischemic heart disease and other diseases of the circulatory system: Secondary | ICD-10-CM | POA: Diagnosis not present

## 2018-02-28 DIAGNOSIS — Z79899 Other long term (current) drug therapy: Secondary | ICD-10-CM | POA: Diagnosis not present

## 2018-02-28 LAB — CBC
HCT: 46.7 % (ref 39.0–52.0)
Hemoglobin: 14.6 g/dL (ref 13.0–17.0)
MCH: 26.3 pg (ref 26.0–34.0)
MCHC: 31.3 g/dL (ref 30.0–36.0)
MCV: 84.1 fL (ref 80.0–100.0)
Platelets: 370 10*3/uL (ref 150–400)
RBC: 5.55 MIL/uL (ref 4.22–5.81)
RDW: 12.9 % (ref 11.5–15.5)
WBC: 10.5 10*3/uL (ref 4.0–10.5)
nRBC: 0 % (ref 0.0–0.2)

## 2018-02-28 NOTE — Telephone Encounter (Signed)
PATIENT CAME IN AND WANTED TO LET THE PROVIDER KNOW THAT HE HAS STARTED HAVING A BURNING PAIN IN HIS CHEST AREA TODAY

## 2018-02-28 NOTE — Telephone Encounter (Signed)
Let's increase Protonix to twice a day, 30 minutes before breakfast and dinner. He is scheduled for EGD 2/20.

## 2018-02-28 NOTE — Telephone Encounter (Signed)
Spoke with pt, he is having some burning in his chest and rt side. He takes the Pantoprazole at night. Pt stated he doesn't eat breakfast and was told it was ok to take it at night. When asked if he felt his burning was food related, pt answered no because he hasn't eaten much yet. Pt isn't experiencing any shortness of breath and was advised to go to the ED if it starts.

## 2018-02-28 NOTE — Telephone Encounter (Signed)
Pt notified of AB recommendations and will increase Protonix to twice daily.

## 2018-02-28 NOTE — Progress Notes (Signed)
cc'ed to pcp °

## 2018-03-03 ENCOUNTER — Encounter (HOSPITAL_COMMUNITY): Payer: Self-pay | Admitting: *Deleted

## 2018-03-03 ENCOUNTER — Encounter (HOSPITAL_COMMUNITY)
Admission: RE | Disposition: A | Discharge status: Home / Self Care | Payer: Self-pay | Source: Home / Self Care | Attending: Internal Medicine

## 2018-03-03 ENCOUNTER — Ambulatory Visit (HOSPITAL_COMMUNITY): Payer: Commercial Managed Care - PPO | Admitting: Anesthesiology

## 2018-03-03 ENCOUNTER — Ambulatory Visit (HOSPITAL_COMMUNITY)
Admission: RE | Admit: 2018-03-03 | Discharge: 2018-03-03 | Disposition: A | Discharge status: Home / Self Care | Payer: Commercial Managed Care - PPO | Attending: Internal Medicine | Admitting: Internal Medicine

## 2018-03-03 DIAGNOSIS — K449 Diaphragmatic hernia without obstruction or gangrene: Secondary | ICD-10-CM | POA: Insufficient documentation

## 2018-03-03 DIAGNOSIS — K921 Melena: Secondary | ICD-10-CM | POA: Diagnosis not present

## 2018-03-03 DIAGNOSIS — Z79899 Other long term (current) drug therapy: Secondary | ICD-10-CM | POA: Insufficient documentation

## 2018-03-03 DIAGNOSIS — K317 Polyp of stomach and duodenum: Secondary | ICD-10-CM | POA: Insufficient documentation

## 2018-03-03 DIAGNOSIS — Z6841 Body Mass Index (BMI) 40.0 and over, adult: Secondary | ICD-10-CM | POA: Insufficient documentation

## 2018-03-03 DIAGNOSIS — Z833 Family history of diabetes mellitus: Secondary | ICD-10-CM | POA: Insufficient documentation

## 2018-03-03 DIAGNOSIS — F419 Anxiety disorder, unspecified: Secondary | ICD-10-CM | POA: Insufficient documentation

## 2018-03-03 DIAGNOSIS — Z8249 Family history of ischemic heart disease and other diseases of the circulatory system: Secondary | ICD-10-CM | POA: Insufficient documentation

## 2018-03-03 DIAGNOSIS — F329 Major depressive disorder, single episode, unspecified: Secondary | ICD-10-CM | POA: Insufficient documentation

## 2018-03-03 HISTORY — PX: ESOPHAGOGASTRODUODENOSCOPY (EGD) WITH PROPOFOL: SHX5813

## 2018-03-03 SURGERY — ESOPHAGOGASTRODUODENOSCOPY (EGD) WITH PROPOFOL
Anesthesia: General

## 2018-03-03 MED ORDER — KETAMINE HCL 50 MG/5ML IJ SOSY
PREFILLED_SYRINGE | INTRAMUSCULAR | Status: AC
  Filled 2018-03-03: qty 5

## 2018-03-03 MED ORDER — PROMETHAZINE HCL 25 MG/ML IJ SOLN: 6.2500 mg | INTRAMUSCULAR | Status: DC | PRN

## 2018-03-03 MED ORDER — STERILE WATER FOR IRRIGATION IR SOLN
Status: DC | PRN
  Administered 2018-03-03: 1.5 mL

## 2018-03-03 MED ORDER — LIDOCAINE HCL 1 % IJ SOLN
INTRAMUSCULAR | Status: DC | PRN
  Administered 2018-03-03: 40 mg via INTRADERMAL

## 2018-03-03 MED ORDER — CHLORHEXIDINE GLUCONATE CLOTH 2 % EX PADS: 6.0000 | MEDICATED_PAD | Freq: Once | CUTANEOUS | Status: DC

## 2018-03-03 MED ORDER — PROPOFOL 500 MG/50ML IV EMUL
INTRAVENOUS | Status: DC | PRN
  Administered 2018-03-03: 150 ug/kg/min via INTRAVENOUS

## 2018-03-03 MED ORDER — GLYCOPYRROLATE 0.2 MG/ML IJ SOLN
INTRAMUSCULAR | Status: DC | PRN
  Administered 2018-03-03: 0.2 mg via INTRAVENOUS

## 2018-03-03 MED ORDER — HYDROCODONE-ACETAMINOPHEN 7.5-325 MG PO TABS: 1.0000 | ORAL_TABLET | Freq: Once | ORAL | Status: DC | PRN

## 2018-03-03 MED ORDER — HYDROMORPHONE HCL 1 MG/ML IJ SOLN: 0.2500 mg | INTRAMUSCULAR | Status: DC | PRN

## 2018-03-03 MED ORDER — LACTATED RINGERS IV SOLN
INTRAVENOUS | Status: DC
  Administered 2018-03-03: 1000 mL via INTRAVENOUS

## 2018-03-03 MED ORDER — KETAMINE HCL 10 MG/ML IJ SOLN
INTRAMUSCULAR | Status: DC | PRN
  Administered 2018-03-03 (×2): 10 mg via INTRAVENOUS

## 2018-03-03 MED ORDER — MIDAZOLAM HCL 2 MG/2ML IJ SOLN: 0.5000 mg | Freq: Once | INTRAMUSCULAR | Status: DC | PRN

## 2018-03-03 MED ORDER — PROPOFOL 10 MG/ML IV BOLUS
INTRAVENOUS | Status: DC | PRN
  Administered 2018-03-03: 30 mg via INTRAVENOUS

## 2018-03-03 NOTE — Transfer of Care (Signed)
Immediate Anesthesia Transfer of Care Note  Patient: CATHY FURY  Procedure(s) Performed: ESOPHAGOGASTRODUODENOSCOPY (EGD) WITH PROPOFOL (N/A )  Patient Location: PACU  Anesthesia Type:General  Level of Consciousness: awake, alert , oriented and patient cooperative  Airway & Oxygen Therapy: Patient Spontanous Breathing  Post-op Assessment: Report given to RN  Post vital signs: Reviewed and stable  Last Vitals:  Vitals Value Taken Time  BP    Temp    Pulse 76 03/03/2018  1:30 PM  Resp    SpO2 94 % 03/03/2018  1:30 PM  Vitals shown include unvalidated device data.  Last Pain:  Vitals:   03/03/18 1315  TempSrc:   PainSc: 0-No pain      Patients Stated Pain Goal: 9 (03/03/18 1046)  Complications: No apparent anesthesia complications

## 2018-03-03 NOTE — Discharge Instructions (Signed)
EGD Discharge instructions Please read the instructions outlined below and refer to this sheet in the next few weeks. These discharge instructions provide you with general information on caring for yourself after you leave the hospital. Your doctor may also give you specific instructions. While your treatment has been planned according to the most current medical practices available, unavoidable complications occasionally occur. If you have any problems or questions after discharge, please call your doctor. ACTIVITY  You may resume your regular activity but move at a slower pace for the next 24 hours.   Take frequent rest periods for the next 24 hours.   Walking will help expel (get rid of) the air and reduce the bloated feeling in your abdomen.   No driving for 24 hours (because of the anesthesia (medicine) used during the test).   You may shower.   Do not sign any important legal documents or operate any machinery for 24 hours (because of the anesthesia used during the test).  NUTRITION  Drink plenty of fluids.   You may resume your normal diet.   Begin with a light meal and progress to your normal diet.   Avoid alcoholic beverages for 24 hours or as instructed by your caregiver.  MEDICATIONS  You may resume your normal medications unless your caregiver tells you otherwise.  WHAT YOU CAN EXPECT TODAY  You may experience abdominal discomfort such as a feeling of fullness or gas pains.  FOLLOW-UP  Your doctor will discuss the results of your test with you.  SEEK IMMEDIATE MEDICAL ATTENTION IF ANY OF THE FOLLOWING OCCUR:  Excessive nausea (feeling sick to your stomach) and/or vomiting.   Severe abdominal pain and distention (swelling).   Trouble swallowing.   Temperature over 101 F (37.8 C).   Rectal bleeding or vomiting of blood.    Monitored Anesthesia Care, Care After These instructions provide you with information about caring for yourself after your procedure.  Your health care provider may also give you more specific instructions. Your treatment has been planned according to current medical practices, but problems sometimes occur. Call your health care provider if you have any problems or questions after your procedure. What can I expect after the procedure? After your procedure, you may:  Feel sleepy for several hours.  Feel clumsy and have poor balance for several hours.  Feel forgetful about what happened after the procedure.  Have poor judgment for several hours.  Feel nauseous or vomit.  Have a sore throat if you had a breathing tube during the procedure. Follow these instructions at home: For at least 24 hours after the procedure:      Have a responsible adult stay with you. It is important to have someone help care for you until you are awake and alert.  Rest as needed.  Do not: ? Participate in activities in which you could fall or become injured. ? Drive. ? Use heavy machinery. ? Drink alcohol. ? Take sleeping pills or medicines that cause drowsiness. ? Make important decisions or sign legal documents. ? Take care of children on your own. Eating and drinking  Follow the diet that is recommended by your health care provider.  If you vomit, drink water, juice, or soup when you can drink without vomiting.  Make sure you have little or no nausea before eating solid foods. General instructions  Take over-the-counter and prescription medicines only as told by your health care provider.  If you have sleep apnea, surgery and certain medicines can increase  your risk for breathing problems. Follow instructions from your health care provider about wearing your sleep device: ? Anytime you are sleeping, including during daytime naps. ? While taking prescription pain medicines, sleeping medicines, or medicines that make you drowsy.  If you smoke, do not smoke without supervision.  Keep all follow-up visits as told by your  health care provider. This is important. Contact a health care provider if:  You keep feeling nauseous or you keep vomiting.  You feel light-headed.  You develop a rash.  You have a fever. Get help right away if:  You have trouble breathing. Summary  For several hours after your procedure, you may feel sleepy and have poor judgment.  Have a responsible adult stay with you for at least 24 hours or until you are awake and alert. This information is not intended to replace advice given to you by your health care provider. Make sure you discuss any questions you have with your health care provider. Document Released: 04/21/2015 Document Revised: 08/14/2016 Document Reviewed: 04/21/2015 Elsevier Interactive Patient Education  2019 [employer]    Continue Protonix 40 mg daily  Avoid aspirin/NSAIDs as much as possible  Office visit with [place] in 3 months

## 2018-03-03 NOTE — Anesthesia Postprocedure Evaluation (Signed)
Anesthesia Post Note  Patient: Donald [name]  Procedure(s) Performed: ESOPHAGOGASTRODUODENOSCOPY (EGD) WITH PROPOFOL (N/A )  Patient location during evaluation: PACU Anesthesia Type: General Level of consciousness: awake and alert and oriented Pain management: pain level controlled Vital Signs Assessment: post-procedure vital signs reviewed and stable Respiratory status: spontaneous breathing Cardiovascular status: stable Postop Assessment: no apparent nausea or vomiting Anesthetic complications: no     Last Vitals:  Vitals:   03/03/18 1046  BP: (!) 142/81  Pulse: 72  Resp: 18  Temp: 37 C  SpO2: 95%    Last Pain:  Vitals:   03/03/18 1315  TempSrc:   PainSc: 0-No pain                 ,  A

## 2018-03-03 NOTE — Anesthesia Preprocedure Evaluation (Signed)
Anesthesia Evaluation  Patient identified by MRN, date of birth, ID band Patient awake    Reviewed: Allergy & Precautions, NPO status , Patient's Chart, lab work & pertinent test results  Airway Mallampati: II  TM Distance: >3 FB Neck ROM: Full    Dental no notable dental hx. (+) Teeth Intact   Pulmonary neg pulmonary ROS,    Pulmonary exam normal breath sounds clear to auscultation       Cardiovascular Exercise Tolerance: Good negative cardio ROS Normal cardiovascular examI Rhythm:Regular Rate:Normal     Neuro/Psych Anxiety Depression negative neurological ROS  negative psych ROS   GI/Hepatic Neg liver ROS, GERD  Medicated and Poorly Controlled,Continues with slight Sx today  Here for EGD for w/u for melena  Denies transfusions in the past    Endo/Other  negative endocrine ROS  Renal/GU negative Renal ROS  negative genitourinary   Musculoskeletal negative musculoskeletal ROS (+)   Abdominal   Peds negative pediatric ROS (+)  Hematology negative hematology ROS (+)   Anesthesia Other Findings   Reproductive/Obstetrics negative OB ROS                             Anesthesia Physical Anesthesia Plan  ASA: III  Anesthesia Plan: General   Post-op Pain Management:    Induction: Intravenous  PONV Risk Score and Plan:   Airway Management Planned: Nasal Cannula and Simple Face Mask  Additional Equipment:   Intra-op Plan:   Post-operative Plan:   Informed Consent: I have reviewed the patients History and Physical, chart, labs and discussed the procedure including the risks, benefits and alternatives for the proposed anesthesia with the patient or authorized representative who has indicated his/her understanding and acceptance.     Dental advisory given  Plan Discussed with: CRNA  Anesthesia Plan Comments: (GA planned ,GETA as needed)        Anesthesia Quick  Evaluation

## 2018-03-03 NOTE — Interval H&P Note (Signed)
History and Physical Interval Note:  03/03/2018 1:06 PM  Donald [name]  has presented today for surgery, with the diagnosis of melena  The various methods of treatment have been discussed with the patient and family. After consideration of risks, benefits and other options for treatment, the patient has consented to  Procedure(s) with comments: ESOPHAGOGASTRODUODENOSCOPY (EGD) WITH PROPOFOL (N/A) - 12:30pm as a surgical intervention .  The patient's history has been reviewed, patient examined, no change in status, stable for surgery.  I have reviewed the patient's chart and labs.  Questions were answered to the patient's satisfaction.     Donald [name]  No change.  Diagnostic EGD per plan.  Patient denies dysphagia.  The risks, benefits, limitations, alternatives and imponderables have been reviewed with the patient. Potential for esophageal dilation, biopsy, etc. have also been reviewed.  Questions have been answered. All parties agreeable.

## 2018-03-03 NOTE — Op Note (Signed)
[facility] Patient Name: Donald [name] Procedure Date: 03/03/2018 1:14 PM MRN: [id number] Date of Birth: [birth year] Attending MD: Gennette Pacobert Michael Rourk , MD CSN: [id number] Age: 33 Admit Type: Outpatient Procedure:                Upper GI endoscopy Indications:              "Melena" normal H and H Providers:                Gennette Pacobert Michael Rourk, MD, Buel ReamAngela A. Thomasena Edisollins RN, RN,                            Edythe ClarityKelly Cox, Technician Referring MD:              Medicines:                Propofol per Anesthesia Complications:            No immediate complications. Estimated Blood Loss:     Estimated blood loss: none. Procedure:                Pre-Anesthesia Assessment:                           - Prior to the procedure, a History and Physical                            was performed, and patient medications and                            allergies were reviewed. The patient's tolerance of                            previous anesthesia was also reviewed. The risks                            and benefits of the procedure and the sedation                            options and risks were discussed with the patient.                            All questions were answered, and informed consent                            was obtained. Prior Anticoagulants: The patient has                            taken no previous anticoagulant or antiplatelet                            agents. ASA Grade Assessment: III - A patient with                            severe systemic disease. After reviewing the risks  and benefits, the patient was deemed in                            satisfactory condition to undergo the procedure.                           After obtaining informed consent, the endoscope was                            passed under direct vision. Throughout the                            procedure, the patient's blood pressure, pulse, and                            oxygen  saturations were monitored continuously. The                            [id number]-[id number] ([id number]) was introduced through the and                            advanced to the third part of duodenum. The upper                            GI endoscopy was accomplished without difficulty.                            The patient tolerated the procedure well. Scope In: 1:21:32 PM Scope Out: 1:24:44 PM Total Procedure Duration: 0 hours 3 minutes 12 seconds  Findings:      The examined esophagus was normal.      The entire examined stomach was normal (minimal polypoid mucosa)      The duodenal bulb, second portion of the duodenum and third portion of       the duodenum were normal. Impression:               - Normal esophagus.                           - Normal stomach (minimal polypoid mucosa of no                            clinical significance)                           - Normal duodenal bulb, second portion of the                            duodenum and third portion of the duodenum.                           - No specimens collected. I doubt report of black                            stools is indicative any significant, if any, GI  bleeding Moderate Sedation:      Moderate (conscious) sedation was administered by the endoscopy nurse       and supervised by the endoscopist. The following parameters were       monitored: oxygen saturation, heart rate, blood pressure, respiratory       rate, EKG, adequacy of pulmonary ventilation, and response to care. Recommendation:           - Patient has a contact number available for                            emergencies. The signs and symptoms of potential                            delayed complications were discussed with the                            patient. Return to normal activities tomorrow.                            Written discharge instructions were provided to the                            patient.                            - Resume previous diet.                           - Continue present medications. Continue Protonix                            40 mg daily. Minimize use of aspirin/NSAIDs.                           - No repeat upper endoscopy.                           - Return to GI office in 3 months. Procedure Code(s):        --- Professional ---                           [phone], Esophagogastroduodenoscopy, flexible,                            transoral; diagnostic, including collection of                            specimen(s) by brushing or washing, when performed                            (separate procedure) Diagnosis Code(s):        --- Professional ---                           K92.1, Melena (includes Hematochezia) CPT copyright 2018 American Medical Association. All rights reserved. The codes documented in this report are preliminary  and upon coder review may  be revised to meet current compliance requirements. Gerrit Friends. Rourk, MD Gennette Pac, MD 03/03/2018 1:31:07 PM This report has been signed electronically. Number of Addenda: 0

## 2018-03-06 ENCOUNTER — Other Ambulatory Visit: Payer: Self-pay

## 2018-03-06 ENCOUNTER — Emergency Department (HOSPITAL_COMMUNITY): Payer: Commercial Managed Care - PPO

## 2018-03-06 ENCOUNTER — Encounter (HOSPITAL_COMMUNITY): Payer: Self-pay | Admitting: Emergency Medicine

## 2018-03-06 ENCOUNTER — Emergency Department (HOSPITAL_COMMUNITY)
Admission: EM | Admit: 2018-03-06 | Discharge: 2018-03-06 | Disposition: A | Discharge status: Home / Self Care | Payer: Commercial Managed Care - PPO | Attending: Emergency Medicine | Admitting: Emergency Medicine

## 2018-03-06 DIAGNOSIS — Z79899 Other long term (current) drug therapy: Secondary | ICD-10-CM | POA: Insufficient documentation

## 2018-03-06 DIAGNOSIS — R1011 Right upper quadrant pain: Secondary | ICD-10-CM | POA: Insufficient documentation

## 2018-03-06 DIAGNOSIS — R109 Unspecified abdominal pain: Secondary | ICD-10-CM

## 2018-03-06 HISTORY — DX: Other chronic pain: G89.29

## 2018-03-06 HISTORY — DX: Unspecified abdominal pain: R10.9

## 2018-03-06 LAB — COMPREHENSIVE METABOLIC PANEL
ALT: 22 U/L (ref 0–44)
AST: 18 U/L (ref 15–41)
Albumin: 4.1 g/dL (ref 3.5–5.0)
Alkaline Phosphatase: 80 U/L (ref 38–126)
Anion gap: 8 (ref 5–15)
BUN: 14 mg/dL (ref 6–20)
CO2: 25 mmol/L (ref 22–32)
Calcium: 8.9 mg/dL (ref 8.9–10.3)
Chloride: 102 mmol/L (ref 98–111)
Creatinine, Ser: 0.94 mg/dL (ref 0.61–1.24)
GFR calc Af Amer: >60 mL/min (ref >60)
GFR calc non Af Amer: >60 mL/min (ref >60)
Glucose, Bld: 109 mg/dL — ABNORMAL HIGH (ref 70–99)
Potassium: 3.9 mmol/L (ref 3.5–5.1)
SODIUM: 135 mmol/L (ref 135–145)
Total Bilirubin: 0.5 mg/dL (ref 0.3–1.2)
Total Protein: 7.9 g/dL (ref 6.5–8.1)

## 2018-03-06 LAB — CBC WITH DIFFERENTIAL/PLATELET
Abs Immature Granulocytes: 0.04 10*3/uL (ref 0.00–0.07)
BASOS ABS: 0.1 10*3/uL (ref 0.0–0.1)
BASOS PCT: 0 %
Eosinophils Absolute: 0.2 10*3/uL (ref 0.0–0.5)
Eosinophils Relative: 2 %
HCT: 47.1 % (ref 39.0–52.0)
Hemoglobin: 14.3 g/dL (ref 13.0–17.0)
Immature Granulocytes: 0 %
Lymphocytes Relative: 17 %
Lymphs Abs: 2.3 10*3/uL (ref 0.7–4.0)
MCH: 26.1 pg (ref 26.0–34.0)
MCHC: 30.4 g/dL (ref 30.0–36.0)
MCV: 86.1 fL (ref 80.0–100.0)
Monocytes Absolute: 1.1 10*3/uL — ABNORMAL HIGH (ref 0.1–1.0)
Monocytes Relative: 8 %
NRBC: 0 % (ref 0.0–0.2)
Neutro Abs: 9.9 10*3/uL — ABNORMAL HIGH (ref 1.7–7.7)
Neutrophils Relative %: 73 %
PLATELETS: 306 10*3/uL (ref 150–400)
RBC: 5.47 MIL/uL (ref 4.22–5.81)
RDW: 12.9 % (ref 11.5–15.5)
WBC: 13.6 10*3/uL — AB (ref 4.0–10.5)

## 2018-03-06 LAB — LIPASE, BLOOD: Lipase: 27 U/L (ref 11–51)

## 2018-03-06 MED ORDER — FAMOTIDINE 20 MG PO TABS: 20.0000 mg | ORAL_TABLET | Freq: Every day | ORAL | 0 refills | Status: DC

## 2018-03-06 MED ORDER — DICYCLOMINE HCL 20 MG PO TABS: 20.0000 mg | ORAL_TABLET | Freq: Four times a day (QID) | ORAL | 0 refills | Status: DC | PRN

## 2018-03-06 MED ORDER — IOHEXOL 300 MG/ML  SOLN
100.0000 mL | Freq: Once | INTRAMUSCULAR | Status: AC | PRN
  Administered 2018-03-06: 100 mL via INTRAVENOUS

## 2018-03-06 MED ORDER — DICYCLOMINE HCL 10 MG/ML IM SOLN
20.0000 mg | Freq: Once | INTRAMUSCULAR | Status: AC
  Administered 2018-03-06: 20 mg via INTRAMUSCULAR
  Filled 2018-03-06: qty 2

## 2018-03-06 MED ORDER — KETOROLAC TROMETHAMINE 30 MG/ML IJ SOLN
30.0000 mg | Freq: Once | INTRAMUSCULAR | Status: AC
  Administered 2018-03-06: 30 mg via INTRAVENOUS
  Filled 2018-03-06: qty 1

## 2018-03-06 MED ORDER — FAMOTIDINE IN NACL 20-0.9 MG/50ML-% IV SOLN
20.0000 mg | Freq: Once | INTRAVENOUS | Status: AC
  Administered 2018-03-06: 20 mg via INTRAVENOUS
  Filled 2018-03-06: qty 50

## 2018-03-06 NOTE — ED Triage Notes (Signed)
Epigastric pain x 3 weeks.  Worse with eating and drinking

## 2018-03-06 NOTE — Discharge Instructions (Signed)
Take the prescriptions as directed.  Increase your fluid intake (ie:  Gatoraide) for the next few days. Eat a bland diet, avoiding greasy, fatty, fried foods, as well as spicy and acidic foods or beverages.  Avoid eating within 2 to 3 hours before going to bed or laying down.  Also avoid teas, colas, coffee, chocolate, pepermint and spearment.  Call your regular medical doctor and your GI doctor tomorrow to schedule a follow up appointment this week.  Return to the Emergency Department immediately if worsening.

## 2018-03-06 NOTE — ED Provider Notes (Signed)
[facility] EMERGENCY DEPARTMENT Provider Note   CSN: [id number] Arrival date & time: 03/06/18  1304    History   Chief Complaint Chief Complaint  Patient presents with  . Abdominal Pain    HPI Donald [name] is a 33 y.o. male.     HPI  Pt was seen at 1340.  Per pt, c/o gradual onset and persistence of constant acute flair of his chronic upper abd "pain" for the past 3 weeks. Has been associated with no other symptoms. Pt has been evaluated by his PMD and GI MD, most recently 3 days ago with EGD (normal), without definitve diagnosis. Denies any change in his usual chronic pain. Denies N/V, no diarrhea, no fevers, no back pain, no rash, no CP/SOB, no black or blood in stools.       Past Medical History:  Diagnosis Date  . Anxiety   . Chronic abdominal pain   . Depression   . Dyspepsia   . Morbid obesity (HCC) 05/10/2017  . Pinched nerve    in his back    Patient Active Problem List   Diagnosis Date Noted  . Melena 02/25/2018  . Morbid obesity (HCC) 05/10/2017  . GAD (generalized anxiety disorder) 10/09/2016  . Mucosal abnormality of stomach   . Dyspepsia 03/20/2014  . Abdominal pain, epigastric 03/20/2014    Past Surgical History:  Procedure Laterality Date  . APPENDECTOMY     July 2019  . ESOPHAGOGASTRODUODENOSCOPY N/A 04/02/2014   RMR: Very small hiatial hernia. Gastric polyps status post biopsy. No endoscopic explanation for patient's symptoms. Bengin fundic gland polyp. Negative H.pylori        Home Medications    Prior to Admission medications   Medication Sig Start Date End Date Taking? Authorizing Provider  ALPRAZolam Prudy Feeler) 0.5 MG tablet 1/2 to 1 bid prn panic attacks Patient taking differently: Take 0.25-0.5 mg by mouth 2 (two) times daily as needed for anxiety (or panic attacks).  10/09/16   Babs Sciara, MD  augmented betamethasone dipropionate (DIPROLENE-AF) 0.05 % cream Apply 1 application topically 2 (two) times daily as needed (for  psoriasis).  02/10/18   [provider]  fluticasone (CUTIVATE) 0.05 % cream Apply 1 application topically 2 (two) times daily as needed (for psoriasis).  02/10/18   [provider]  pantoprazole (PROTONIX) 40 MG tablet Take 1 tablet (40 mg total) by mouth daily. 12/27/17   Jeannine Boga, NP  sertraline (ZOLOFT) 100 MG tablet Take 1 tablet (100 mg total) by mouth daily. 11/09/17   Babs Sciara, MD  sucralfate (CARAFATE) 1 GM/10ML suspension Take 10 mLs (1 g total) by mouth 4 (four) times daily -  with meals and at bedtime. 02/21/18   Babs Sciara, MD    Family History Family History  Problem Relation Age of Onset  . Diabetes Father   . Hypertension Father   . Colon cancer Neg Hx   . Stomach cancer Neg Hx   . Colon polyps Neg Hx     Social History Social History   Tobacco Use  . Smoking status: Never Smoker  . Smokeless tobacco: Never Used  Substance Use Topics  . Alcohol use: Not Currently    Alcohol/week: 0.0 standard drinks    Comment: rarely, "once in a blue moon"  . Drug use: No     Allergies   Patient has no known allergies.   Review of Systems Review of Systems ROS: Statement: All systems negative except as marked or noted  in the HPI; Constitutional: Negative for fever and chills. ; ; Eyes: Negative for eye pain, redness and discharge. ; ; ENMT: Negative for ear pain, hoarseness, nasal congestion, sinus pressure and sore throat. ; ; Cardiovascular: Negative for chest pain, palpitations, diaphoresis, dyspnea and peripheral edema. ; ; Respiratory: Negative for cough, wheezing and stridor. ; ; Gastrointestinal: +abd pain. Negative for nausea, vomiting, diarrhea, blood in stool, hematemesis, jaundice and rectal bleeding. . ; ; Genitourinary: Negative for dysuria, flank pain and hematuria. ; ; Musculoskeletal: Negative for back pain and neck pain. Negative for swelling and trauma.; ; Skin: Negative for pruritus, rash, abrasions, blisters, bruising and  skin lesion.; ; Neuro: Negative for headache, lightheadedness and neck stiffness. Negative for weakness, altered level of consciousness, altered mental status, extremity weakness, paresthesias, involuntary movement, seizure and syncope.       Physical Exam Updated Vital Signs BP 133/81 (BP Location: Right Arm)   Pulse 73   Temp 98.2 F (36.8 C) (Oral)   Resp 19   Ht  (1.905 m)   Wt (!) 152 kg   SpO2 99%   BMI 41.88 kg/m   Physical Exam 1345: Physical examination:  Nursing notes reviewed; Vital signs and O2 SAT reviewed;  Constitutional: Well developed, Well nourished, Well hydrated, In no acute distress; Head:  Normocephalic, atraumatic; Eyes: EOMI, PERRL, No scleral icterus; ENMT: Mouth and pharynx normal, Mucous membranes moist; Neck: Supple, Full range of motion, No lymphadenopathy; Cardiovascular: Regular rate and rhythm, No gallop; Respiratory: Breath sounds clear & equal bilaterally, No wheezes.  Speaking full sentences with ease, Normal respiratory effort/excursion; Chest: Nontender, Movement normal; Abdomen: Soft, +mild RUQ and mid-epigastric tenderness to palp. No rebound or guarding. Nondistended, Normal bowel sounds; Genitourinary: No CVA tenderness; Extremities: Peripheral pulses normal, No tenderness, No edema, No calf edema or asymmetry.; Neuro: AA&Ox3, Major CN grossly intact.  Speech clear. No gross focal motor or sensory deficits in extremities.; Skin: Color normal, Warm, Dry.; Psych:  Affect flat.     ED Treatments / Results  Labs (all labs ordered are listed, but only abnormal results are displayed)   EKG None  Radiology   Procedures Procedures (including critical care time)  Medications Ordered in ED Medications  ketorolac (TORADOL) 30 MG/ML injection 30 mg (has no administration in time range)  dicyclomine (BENTYL) injection 20 mg (has no administration in time range)  famotidine (PEPCID) IVPB 20 mg premix (has no administration in time range)      Initial Impression / Assessment and Plan / ED Course  I have reviewed the triage vital signs and the nursing notes.  Pertinent labs & imaging results that were available during my care of the patient were reviewed by me and considered in my medical decision making (see chart for details).    MDM Reviewed: previous chart, nursing note and vitals Reviewed previous: labs Interpretation: labs, x-ray and CT scan   Results for orders placed or performed during the hospital encounter of 03/06/18  Comprehensive metabolic panel  Result Value Ref Range   Sodium 135 135 - 145 mmol/L   Potassium 3.9 3.5 - 5.1 mmol/L   Chloride 102 98 - 111 mmol/L   CO2 25 22 - 32 mmol/L   Glucose, Bld 109 (H) 70 - 99 mg/dL   BUN 14 6 - 20 mg/dL   Creatinine, Ser 1.61 0.61 - 1.24 mg/dL   Calcium 8.9 8.9 - 09.6 mg/dL   Total Protein 7.9 6.5 - 8.1 g/dL   Albumin 4.1 3.5 -  5.0 g/dL   AST 18 15 - 41 U/L   ALT 22 0 - 44 U/L   Alkaline Phosphatase 80 38 - 126 U/L   Total Bilirubin 0.5 0.3 - 1.2 mg/dL   GFR calc non Af Amer >60 >60 mL/min   GFR calc Af Amer >60 >60 mL/min   Anion gap 8 5 - 15  Lipase, blood  Result Value Ref Range   Lipase 27 11 - 51 U/L  CBC with Differential  Result Value Ref Range   WBC 13.6 (H) 4.0 - 10.5 K/uL   RBC 5.47 4.22 - 5.81 MIL/uL   Hemoglobin 14.3 13.0 - 17.0 g/dL   HCT 40.9 81.1 - 91.4 %   MCV 86.1 80.0 - 100.0 fL   MCH 26.1 26.0 - 34.0 pg   MCHC 30.4 30.0 - 36.0 g/dL   RDW 78.2 95.6 - 21.3 %   Platelets 306 150 - 400 K/uL   nRBC 0.0 0.0 - 0.2 %   Neutrophils Relative % 73 %   Neutro Abs 9.9 (H) 1.7 - 7.7 K/uL   Lymphocytes Relative 17 %   Lymphs Abs 2.3 0.7 - 4.0 K/uL   Monocytes Relative 8 %   Monocytes Absolute 1.1 (H) 0.1 - 1.0 K/uL   Eosinophils Relative 2 %   Eosinophils Absolute 0.2 0.0 - 0.5 K/uL   Basophils Relative 0 %   Basophils Absolute 0.1 0.0 - 0.1 K/uL   Immature Granulocytes 0 %   Abs Immature Granulocytes 0.04 0.00 - 0.07 K/uL   Dg  Chest 2 View Result Date: 03/06/2018 CLINICAL DATA:  Abdominal pain EXAM: CHEST - 2 VIEW COMPARISON:  None. FINDINGS: The lungs are clear without focal pneumonia, edema, pneumothorax or pleural effusion. The cardiopericardial silhouette is within normal limits for size. The visualized bony structures of the thorax are intact. IMPRESSION: No active cardiopulmonary disease. Electronically Signed   By: Kennith Center M.D.   On: 03/06/2018 15:52   Ct Abdomen Pelvis W Contrast Result Date: 03/06/2018 CLINICAL DATA:  Acute epigastric abdominal pain, worse postprandial. EXAM: CT ABDOMEN AND PELVIS WITH CONTRAST TECHNIQUE: Multidetector CT imaging of the abdomen and pelvis was performed using the standard protocol following bolus administration of intravenous contrast. CONTRAST:  OMNIPAQUE IOHEXOL 300 MG/ML  SOLN COMPARISON:  08/02/2017 CT abdomen/pelvis. FINDINGS: Lower chest: No significant pulmonary nodules or acute consolidative airspace disease. Hepatobiliary: Normal liver size. No liver mass. Normal gallbladder with no radiopaque cholelithiasis. No biliary ductal dilatation. Pancreas: Normal, with no mass or duct dilation. Spleen: Normal size. No mass. Adrenals/Urinary Tract: Normal adrenals. Normal kidneys with no hydronephrosis and no renal mass. Normal bladder. Stomach/Bowel: Normal non-distended stomach. Normal caliber small bowel with no small bowel wall thickening. Appendectomy. Mild left colonic diverticulosis, with no large bowel wall thickening or significant pericolonic fat stranding. Vascular/Lymphatic: Normal caliber abdominal aorta. Patent portal, splenic, hepatic and renal veins. No pathologically enlarged lymph nodes in the abdomen or pelvis. Reproductive: Normal size prostate. Other: No pneumoperitoneum, ascites or focal fluid collection. Musculoskeletal: No aggressive appearing focal osseous lesions. IMPRESSION: No acute abnormality. No evidence of bowel obstruction or acute bowel  inflammation. Mild left colonic diverticulosis, with no evidence of acute diverticulitis. Electronically Signed   By: Delbert Phenix M.D.   On: 03/06/2018 16:14     1615:  Epic chart reviewed: pt apparently having long term issues with this abd pain without definitive dx. Workup today is reassuring.  Pt has tol PO well while in the ED without  N/V.  No stooling while in the ED.  Abd benign, VSS. Feels better and wants to go home now. Tx symptomatically, f/u PMD and GI MD. Dx and testing d/w pt and family.  Questions answered.  Verb understanding, agreeable to d/c home with outpt f/u.     Final Clinical Impressions(s) / ED Diagnoses   Final diagnoses:  None    ED Discharge Orders    None       Samuel Jester, DO 03/11/18 8299

## 2018-03-07 ENCOUNTER — Encounter (HOSPITAL_COMMUNITY): Payer: Self-pay | Admitting: Internal Medicine

## 2018-03-21 ENCOUNTER — Other Ambulatory Visit: Payer: Self-pay

## 2018-03-21 MED ORDER — PANTOPRAZOLE SODIUM 40 MG PO TBEC: 40.0000 mg | DELAYED_RELEASE_TABLET | Freq: Every day | ORAL | 1 refills | Status: DC

## 2018-03-23 ENCOUNTER — Ambulatory Visit: Payer: Commercial Managed Care - PPO | Admitting: Family Medicine

## 2018-03-23 ENCOUNTER — Encounter: Payer: Self-pay | Admitting: Family Medicine

## 2018-03-23 ENCOUNTER — Ambulatory Visit (INDEPENDENT_AMBULATORY_CARE_PROVIDER_SITE_OTHER): Payer: Commercial Managed Care - PPO | Admitting: Family Medicine

## 2018-03-23 ENCOUNTER — Other Ambulatory Visit: Payer: Self-pay

## 2018-03-23 VITALS — BP 120/78 | Temp 98.1°F | Wt 335.6 lb

## 2018-03-23 DIAGNOSIS — R05 Cough: Secondary | ICD-10-CM | POA: Diagnosis not present

## 2018-03-23 DIAGNOSIS — J029 Acute pharyngitis, unspecified: Secondary | ICD-10-CM

## 2018-03-23 DIAGNOSIS — R509 Fever, unspecified: Secondary | ICD-10-CM | POA: Diagnosis not present

## 2018-03-23 DIAGNOSIS — R6889 Other general symptoms and signs: Secondary | ICD-10-CM | POA: Diagnosis not present

## 2018-03-23 DIAGNOSIS — R51 Headache: Secondary | ICD-10-CM | POA: Diagnosis not present

## 2018-03-23 LAB — POCT INFLUENZA A/B
Influenza A, POC: POSITIVE — AB
Influenza B, POC: NEGATIVE

## 2018-03-23 LAB — POCT RAPID STREP A (OFFICE): Rapid Strep A Screen: POSITIVE — AB

## 2018-03-23 MED ORDER — OSELTAMIVIR PHOSPHATE 75 MG PO CAPS: 75.0000 mg | ORAL_CAPSULE | Freq: Two times a day (BID) | ORAL | 0 refills | Status: DC

## 2018-03-23 MED ORDER — AZITHROMYCIN 250 MG PO TABS: ORAL_TABLET | ORAL | 0 refills | Status: DC

## 2018-03-23 NOTE — Patient Instructions (Signed)

## 2018-03-23 NOTE — Progress Notes (Addendum)
   Subjective:    Patient ID: Donald [name], male    DOB: 1985/07/08, 33 y.o.   MRN: [id number]  Sore Throat   This is a new problem. The current episode started yesterday. Associated symptoms include congestion and headaches. Associated symptoms comments: Body aches. He has had exposure to strep. Exposure to: son just had strep.   Patient with sore throat head congestion drainage aching body aches low-grade fever chills sweats  Results for orders placed or performed in visit on 03/23/18  POCT rapid strep A  Result Value Ref Range   Rapid Strep A Screen Positive (A) Negative  POCT Influenza A/B  Result Value Ref Range   Influenza A, POC Positive (A) Negative   Influenza B, POC Negative Negative     Review of Systems  HENT: Positive for congestion.   Neurological: Positive for headaches.  Denies nausea vomiting diarrhea Does relate head congestion sore throat some sweats chills    Objective:   Physical Exam Eardrums are normal throat minimal erythema neck is no masses lungs are clear no crackles heart regular   Influenza-the patient was diagnosed with influenza. Patient/family educated about the flu and warning signs to watch for. If difficulty breathing,  cyanosis, disorientation, or progressive worsening then immediately get rechecked at the ER. If progressive symptoms be certain to be rechecked. Supportive measures such as Tylenol/ibuprofen was discussed. No aspirin use in children.     Assessment & Plan:  Pharyngitis Strep test positive Antibiotics prescribed warning signs discussed Flu test was also completed because of his symptomatology and actually showed positive [place] therefore Tamiflu twice daily 5 days warning signs discussed regarding secondary infections

## 2018-03-29 ENCOUNTER — Encounter: Payer: Self-pay | Admitting: Family Medicine

## 2018-03-29 ENCOUNTER — Telehealth: Payer: Self-pay | Admitting: Family Medicine

## 2018-03-29 ENCOUNTER — Other Ambulatory Visit: Payer: Self-pay | Admitting: Family Medicine

## 2018-03-29 MED ORDER — CLARITHROMYCIN 500 MG PO TABS: ORAL_TABLET | ORAL | 0 refills | Status: DC

## 2018-03-29 NOTE — Telephone Encounter (Signed)
Thru sunday 

## 2018-03-29 NOTE — Telephone Encounter (Signed)
Pt contacted and informed of antibiotic sent in. Pt states he would need a doctors note to return to work. Pt states that he would need a note from 03/29/18- whenever he thinks provider feels it is safe to go back to work. Pt states that his work is not allowing any one to work with cough or any other symptoms.

## 2018-03-29 NOTE — Telephone Encounter (Signed)
One more round of abx biaxin 500 bid ten d

## 2018-03-29 NOTE — Telephone Encounter (Signed)
Pt was seen last week with flu and strep. Pt states he finished his medication on Sunday and starting last night he started feeling bad again. He is having cough and ear pain. No fever or chest pain and he is not SOB.   [facility] - [place] [place] - [address]

## 2018-03-29 NOTE — Telephone Encounter (Signed)
Seen 3/11 and had positive flu and strep test. Prescribed tamiflu and zpack. Finished meds on Sunday and felt better until Monday night and this morning. Started having cough, body aches, ear pain, sore throat, no fever, no sob.

## 2018-03-29 NOTE — Telephone Encounter (Signed)
Please give patient work note from 03/29/18-04/03/18. Thank you

## 2018-03-31 ENCOUNTER — Encounter: Payer: Self-pay | Admitting: Internal Medicine

## 2018-05-24 ENCOUNTER — Ambulatory Visit: Payer: Commercial Managed Care - PPO | Admitting: Family Medicine

## 2018-05-24 ENCOUNTER — Encounter: Payer: Self-pay | Admitting: Family Medicine

## 2018-05-24 ENCOUNTER — Ambulatory Visit (INDEPENDENT_AMBULATORY_CARE_PROVIDER_SITE_OTHER): Payer: Commercial Managed Care - PPO | Admitting: Family Medicine

## 2018-05-24 ENCOUNTER — Other Ambulatory Visit: Payer: Self-pay

## 2018-05-24 DIAGNOSIS — N529 Male erectile dysfunction, unspecified: Secondary | ICD-10-CM | POA: Diagnosis not present

## 2018-05-24 DIAGNOSIS — K219 Gastro-esophageal reflux disease without esophagitis: Secondary | ICD-10-CM

## 2018-05-24 DIAGNOSIS — F411 Generalized anxiety disorder: Secondary | ICD-10-CM

## 2018-05-24 MED ORDER — SILDENAFIL CITRATE 100 MG PO TABS: 50.0000 mg | ORAL_TABLET | Freq: Every day | ORAL | 11 refills | Status: DC | PRN

## 2018-05-24 NOTE — Progress Notes (Signed)
Subjective:    Patient ID: Donald [name], male    DOB: [birth year], 33 y.o.   MRN: [id number]  Anxiety  Presents for follow-up visit. Patient reports no chest pain, confusion, dizziness or shortness of breath.    pt states he rarely has to take xanax. Patient does have generalized anxiety disorder and stressed out a fair amount but doing well recently states that her current dose of the sertraline doing much better for him.  He also has reflux disease takes pantoprazole on a daily basis states he does do a good job keeping his symptoms under good control  Patient also has morbid obesity but he is trying to watch his diet cut calories and stay physically active  Patient is working every day but doing the best he can at minimizing the risk of coronavirus.  Patient does have erectile dysfunction more than likely related to the sertraline he states it occurred more so when the dose of it went up therefore he would like to try medication.  15 minutes was spent with patient today discussing healthcare issues which they came.  More than 50% of this visit-total duration of visit-was spent in counseling and coordination of care.  Please see diagnosis regarding the focus of this coordination and care  Needs refill on protonix and sertraline. Pt states meds are working well and no problems.   Virtual Visit via Video Note  I connected with Donald [name] on 05/24/18 at 10:00 AM EDT by a video enabled telemedicine application and verified that I am speaking with the correct person using two identifiers.  Location: Patient: home Provider: office   I discussed the limitations of evaluation and management by telemedicine and the availability of in person appointments. The patient expressed understanding and agreed to proceed.  History of Present Illness:    Observations/Objective:   Assessment and Plan:   Follow Up Instructions:    I discussed the assessment and treatment plan with  the patient. The patient was provided an opportunity to ask questions and all were answered. The patient agreed with the plan and demonstrated an understanding of the instructions.   The patient was advised to call back or seek an in-person evaluation if the symptoms worsen or if the condition fails to improve as anticipated.  I provided 15 minutes of non-face-to-face time during this encounter.      Review of Systems  Constitutional: Negative for diaphoresis and fatigue.  HENT: Negative for congestion and rhinorrhea.   Respiratory: Negative for cough and shortness of breath.   Cardiovascular: Negative for chest pain and leg swelling.  Gastrointestinal: Negative for abdominal pain and diarrhea.  Skin: Negative for color change and rash.  Neurological: Negative for dizziness and headaches.  Psychiatric/Behavioral: Negative for behavioral problems and confusion.       Objective:   Physical Exam  Unable to do physical exam via video but patient does appear to be of good health and breathing normal      Assessment & Plan:  GAD-continue sertraline on a daily basis and Xanax rarely patient to follow-up if any ongoing troubles otherwise recheck 6 months  Morbid obesity try to do the best can try to minimize calories and increase physical activity to lose weight  Reflux doing well with the medication tries watch diet try to lose weight  Erectile dysfunction is interested in trying sildenafil we will check with it and drug to see what is the best choice for him cost wise  Follow-up 6  months

## 2018-07-05 ENCOUNTER — Ambulatory Visit: Payer: Commercial Managed Care - PPO | Admitting: Gastroenterology

## 2018-07-18 ENCOUNTER — Other Ambulatory Visit: Payer: Self-pay | Admitting: Family Medicine

## 2018-07-21 ENCOUNTER — Ambulatory Visit (INDEPENDENT_AMBULATORY_CARE_PROVIDER_SITE_OTHER): Payer: Commercial Managed Care - PPO | Admitting: Family Medicine

## 2018-07-21 ENCOUNTER — Encounter: Payer: Self-pay | Admitting: Family Medicine

## 2018-07-21 ENCOUNTER — Other Ambulatory Visit: Payer: Self-pay

## 2018-07-21 DIAGNOSIS — L2389 Allergic contact dermatitis due to other agents: Secondary | ICD-10-CM | POA: Diagnosis not present

## 2018-07-21 MED ORDER — PREDNISONE 20 MG PO TABS: ORAL_TABLET | ORAL | 0 refills | Status: DC

## 2018-07-21 MED ORDER — HYDROXYZINE HCL 25 MG PO TABS: ORAL_TABLET | ORAL | 1 refills | Status: DC

## 2018-07-21 NOTE — Progress Notes (Signed)
   Subjective:    Patient ID: Donald [name], male    DOB: 1985/08/22, 33 y.o.   MRN: [id number]  Rash This is a new problem. The affected locations include the abdomen and chest (both sides). The rash is characterized by itchiness. He was exposed to nothing. (Body aches) Past treatments include nothing.  Patient has a rash on the chest abdomen itches denies any significant redness with it no tick bite denies fever chills sweats Having some fatigue but no COVID symptoms overall Virtual Visit via Video Note  I connected with Donald [name] on 07/21/18 at  4:10 PM EDT by a video enabled telemedicine application and verified that I am speaking with the correct person using two identifiers.  Location: Patient: home Provider: office   I discussed the limitations of evaluation and management by telemedicine and the availability of in person appointments. The patient expressed understanding and agreed to proceed.  History of Present Illness:    Observations/Objective:   Assessment and Plan:   Follow Up Instructions:    I discussed the assessment and treatment plan with the patient. The patient was provided an opportunity to ask questions and all were answered. The patient agreed with the plan and demonstrated an understanding of the instructions.   The patient was advised to call back or seek an in-person evaluation if the symptoms worsen or if the condition fails to improve as anticipated.  I provided 15 minutes of non-face-to-face time during this encounter.   Vicente Males, LPN    Review of Systems  Skin: Positive for rash.   Denies fever chills sweats vomiting diarrhea sore throat runny nose cough wheezing difficulty breathing    Objective:   Physical Exam  Patient had virtual visit Appears to be in no distress Atraumatic Neuro able to relate and oriented No apparent resp distress Color normal He does have a rash on chest and abdomen appears to be contact  dermatitis but difficult to tell for certain via video      Assessment & Plan:  Probable contact dermatitis it is reasonable to go ahead with prednisone taper and hydroxyzine to follow-up if progressive troubles or worse warning signs discussed if not better by early next week recheck may need to recheck in person warning signs regarding COVID discussed

## 2018-09-08 ENCOUNTER — Ambulatory Visit (INDEPENDENT_AMBULATORY_CARE_PROVIDER_SITE_OTHER): Payer: Commercial Managed Care - PPO | Admitting: Family Medicine

## 2018-09-08 ENCOUNTER — Other Ambulatory Visit: Payer: Self-pay

## 2018-09-08 ENCOUNTER — Encounter: Payer: Self-pay | Admitting: Family Medicine

## 2018-09-08 DIAGNOSIS — R0602 Shortness of breath: Secondary | ICD-10-CM | POA: Diagnosis not present

## 2018-09-08 DIAGNOSIS — R6889 Other general symptoms and signs: Secondary | ICD-10-CM | POA: Diagnosis not present

## 2018-09-08 DIAGNOSIS — Z20822 Contact with and (suspected) exposure to covid-19: Secondary | ICD-10-CM

## 2018-09-08 DIAGNOSIS — R51 Headache: Secondary | ICD-10-CM

## 2018-09-08 NOTE — Progress Notes (Signed)
   Subjective:    Patient ID: Donald [name], male    DOB: 08-18-1985, 33 y.o.   MRN: [id number]  HPISOB, headache, body aches, dizzy and weakness. Started 5 days ago. Does not think he has a fever. Has not been able to check it. Pt states he had a covid test at [facility] on Monday and got the results last night that it was negative.  Patient relates about 5 days of body aches headache and feeling fatigued also get little short of breath at times although not severe he states it happened once in the car and once earlier today but currently right now is able to converse freely without shortness of breath he does state he gets fatigued with moving from room to room.  Does have some hoarseness in his throat along with feeling tired his wife is having runny nose and diarrhea the patient is having runny nose patient recently went to the beach within the past couple weeks PMH benign Virtual Visit via Video Note  I connected with Donald [name] on 09/08/18 at  9:30 AM EDT by a video enabled telemedicine application and verified that I am speaking with the correct person using two identifiers.  Location: Patient: home Provider: office   I discussed the limitations of evaluation and management by telemedicine and the availability of in person appointments. The patient expressed understanding and agreed to proceed.  History of Present Illness:    Observations/Objective:   Assessment and Plan:   Follow Up Instructions:    I discussed the assessment and treatment plan with the patient. The patient was provided an opportunity to ask questions and all were answered. The patient agreed with the plan and demonstrated an understanding of the instructions.   The patient was advised to call back or seek an in-person evaluation if the symptoms worsen or if the condition fails to improve as anticipated.  I provided 16 minutes of non-face-to-face time during this encounter.       Review of  Systems  Constitutional: Negative for activity change, chills and fever.  HENT: Positive for congestion and rhinorrhea. Negative for ear pain.   Eyes: Negative for discharge.  Respiratory: Positive for shortness of breath. Negative for cough and wheezing.   Cardiovascular: Negative for chest pain.  Gastrointestinal: Negative for nausea and vomiting.  Musculoskeletal: Negative for arthralgias.       Objective:   Physical Exam Patient had virtual visit Appears to be in no distress Atraumatic Neuro able to relate and oriented No apparent resp distress Color normal  Patient did not seem to be in respiratory distress       Assessment & Plan:  So this is concerning for the possibility of COVID Had a negative test but that does not necessarily mean that he does not have been I also told him based on his wife's symptoms she should be tested and connect with her primary care provider I did talk with him about warning signs to watch for if he should feel dehydrated or have significant shortness of breath passing out spells etc. he should go to the emergency department immediately Currently I do not feel he needs x-rays oxygen or any type of antibiotics Work excuse for at least 10 days

## 2018-09-10 ENCOUNTER — Telehealth: Payer: Self-pay | Admitting: Family Medicine

## 2018-09-10 NOTE — Telephone Encounter (Signed)
I did call the patient to follow-up with him He had significant shortness of breath yesterday but he did not feel it was bad enough to go to ER He did state that he got up and he felt dizzy and almost passed out but did not pass out He states he is feeling better today We did discuss warning signs to watch for and if he does get progressively worse ER would be the next step

## 2018-09-11 ENCOUNTER — Encounter (HOSPITAL_COMMUNITY): Payer: Self-pay | Admitting: Emergency Medicine

## 2018-09-11 ENCOUNTER — Other Ambulatory Visit: Payer: Self-pay

## 2018-09-11 ENCOUNTER — Emergency Department (HOSPITAL_COMMUNITY): Payer: Commercial Managed Care - PPO

## 2018-09-11 ENCOUNTER — Emergency Department (HOSPITAL_COMMUNITY)
Admission: EM | Admit: 2018-09-11 | Discharge: 2018-09-11 | Disposition: A | Discharge status: Home / Self Care | Payer: Commercial Managed Care - PPO | Attending: Emergency Medicine | Admitting: Emergency Medicine

## 2018-09-11 DIAGNOSIS — B349 Viral infection, unspecified: Secondary | ICD-10-CM | POA: Diagnosis not present

## 2018-09-11 DIAGNOSIS — R52 Pain, unspecified: Secondary | ICD-10-CM | POA: Diagnosis present

## 2018-09-11 DIAGNOSIS — Z79899 Other long term (current) drug therapy: Secondary | ICD-10-CM | POA: Insufficient documentation

## 2018-09-11 LAB — COMPREHENSIVE METABOLIC PANEL WITH GFR
ALT: 23 U/L (ref 0–44)
AST: 17 U/L (ref 15–41)
Albumin: 4.2 g/dL (ref 3.5–5.0)
Alkaline Phosphatase: 76 U/L (ref 38–126)
Anion gap: 9 (ref 5–15)
BUN: 15 mg/dL (ref 6–20)
CO2: 22 mmol/L (ref 22–32)
Calcium: 9.3 mg/dL (ref 8.9–10.3)
Chloride: 104 mmol/L (ref 98–111)
Creatinine, Ser: 0.92 mg/dL (ref 0.61–1.24)
GFR calc Af Amer: >60 mL/min
GFR calc non Af Amer: >60 mL/min
Glucose, Bld: 98 mg/dL (ref 70–99)
Potassium: 3.9 mmol/L (ref 3.5–5.1)
Sodium: 135 mmol/L (ref 135–145)
Total Bilirubin: 0.3 mg/dL (ref 0.3–1.2)
Total Protein: 8 g/dL (ref 6.5–8.1)

## 2018-09-11 LAB — CBC
HCT: 48.3 % (ref 39.0–52.0)
Hemoglobin: 15.4 g/dL (ref 13.0–17.0)
MCH: 27 pg (ref 26.0–34.0)
MCHC: 31.9 g/dL (ref 30.0–36.0)
MCV: 84.6 fL (ref 80.0–100.0)
Platelets: 378 10*3/uL (ref 150–400)
RBC: 5.71 MIL/uL (ref 4.22–5.81)
RDW: 12.5 % (ref 11.5–15.5)
WBC: 13.6 10*3/uL — ABNORMAL HIGH (ref 4.0–10.5)
nRBC: 0 % (ref 0.0–0.2)

## 2018-09-11 LAB — LIPASE, BLOOD: Lipase: 30 U/L (ref 11–51)

## 2018-09-11 MED ORDER — NAPROXEN 500 MG PO TABS: 500.0000 mg | ORAL_TABLET | Freq: Two times a day (BID) | ORAL | 0 refills | Status: DC

## 2018-09-11 MED ORDER — ACETAMINOPHEN 325 MG PO TABS
650.0000 mg | ORAL_TABLET | Freq: Once | ORAL | Status: AC
  Administered 2018-09-11: 19:00:00 650 mg via ORAL
  Filled 2018-09-11: qty 2

## 2018-09-11 MED ORDER — METOCLOPRAMIDE HCL 5 MG/ML IJ SOLN
10.0000 mg | Freq: Once | INTRAMUSCULAR | Status: DC
  Filled 2018-09-11: qty 2

## 2018-09-11 MED ORDER — DIPHENHYDRAMINE HCL 50 MG/ML IJ SOLN
12.5000 mg | Freq: Once | INTRAMUSCULAR | Status: DC
  Filled 2018-09-11: qty 1

## 2018-09-11 MED ORDER — DEXAMETHASONE SODIUM PHOSPHATE 10 MG/ML IJ SOLN
10.0000 mg | Freq: Once | INTRAMUSCULAR | Status: DC
  Filled 2018-09-11: qty 1

## 2018-09-11 MED ORDER — DEXAMETHASONE SODIUM PHOSPHATE 10 MG/ML IJ SOLN
10.0000 mg | Freq: Once | INTRAMUSCULAR | Status: AC
  Administered 2018-09-11: 10 mg via INTRAMUSCULAR

## 2018-09-11 MED ORDER — NAPROXEN 250 MG PO TABS
500.0000 mg | ORAL_TABLET | Freq: Once | ORAL | Status: AC
  Administered 2018-09-11: 19:00:00 500 mg via ORAL
  Filled 2018-09-11: qty 2

## 2018-09-11 MED ORDER — ALBUTEROL SULFATE HFA 108 (90 BASE) MCG/ACT IN AERS
2.0000 | INHALATION_SPRAY | Freq: Once | RESPIRATORY_TRACT | Status: AC
  Administered 2018-09-11: 19:00:00 2 via RESPIRATORY_TRACT
  Filled 2018-09-11: qty 6.7

## 2018-09-11 MED ORDER — FLUTICASONE PROPIONATE 50 MCG/ACT NA SUSP: 1.0000 | Freq: Every day | NASAL | 0 refills | Status: DC

## 2018-09-11 MED ORDER — DICYCLOMINE HCL 10 MG PO CAPS
20.0000 mg | ORAL_CAPSULE | Freq: Once | ORAL | Status: AC
  Administered 2018-09-11: 20 mg via ORAL
  Filled 2018-09-11: qty 2

## 2018-09-11 NOTE — ED Triage Notes (Signed)
Pt reports for over a week he has been having body aches, fatigue, headache, and shortness of breath. 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

## 2018-09-11 NOTE — ED Provider Notes (Signed)
[facility] EMERGENCY DEPARTMENT Provider Note   CSN: [id number] Arrival date & time: 09/11/18  1646     History   Chief Complaint Chief Complaint  Patient presents with   Generalized Body Aches    HPI [name] is a 33 y.o. male with a hx of morbid obesity, anxiety, depression, GERD, & chronic abdominal pain who presents to the ED with multiple complaints x 1 week. Patient states he has been feeling generally poorly with sxs including headaches (gradual onset, similar to prior), congestion, ear pain, hoarseness, mild cough, intermittent dyspnea, intermittent chest pain (not exertional), abdominal pain, fatigue, chills, & generalized body aches. Sxs are w/o alleviating/aggravating factors. Tried motrin @ home w/o relief. Seen by PCP and had 2 covid tests- 1st negative, 2nd pending results. No known contact/exposures to anyone w/ lab confirmed positive case. Denies fever, N/V/D, constipation, dizziness, or syncope. Denies unilateral leg pain/swelling, hemoptysis, recent surgery/trauma, recent long travel, hormone use, personal hx of cancer, or hx of DVT/PE.   HPI  Past Medical History:  Diagnosis Date   Anxiety    Chronic abdominal pain    Depression    Dyspepsia    Morbid obesity (HCC) 05/10/2017   Pinched nerve    in his back    Patient Active Problem List   Diagnosis Date Noted   GERD (gastroesophageal reflux disease) 05/24/2018   Melena 02/25/2018   Morbid obesity (HCC) 05/10/2017   GAD (generalized anxiety disorder) 10/09/2016   Dyspepsia 03/20/2014    Past Surgical History:  Procedure Laterality Date   APPENDECTOMY     July 2019   ESOPHAGOGASTRODUODENOSCOPY N/A 04/02/2014   RMR: Very small hiatial hernia. Gastric polyps status post biopsy. No endoscopic explanation for patient's symptoms. Bengin fundic gland polyp. Negative H.pylori   ESOPHAGOGASTRODUODENOSCOPY (EGD) WITH PROPOFOL N/A 03/03/2018   Procedure: ESOPHAGOGASTRODUODENOSCOPY (EGD) WITH  PROPOFOL;  Surgeon: Corbin Adeourk, Robert M, MD;  Location: [facility];  Service: Endoscopy;  Laterality: N/A;  12:30pm        Home Medications    Prior to Admission medications   Medication Sig Start Date End Date Taking? Authorizing Provider  ALPRAZolam Prudy Feeler(XANAX) 0.5 MG tablet 1/2 to 1 bid prn panic attacks 10/09/16   Babs SciaraLuking, Scott A, MD  augmented betamethasone dipropionate (DIPROLENE-AF) 0.05 % cream Apply 1 application topically 2 (two) times daily as needed (for psoriasis).  02/10/18   [provider]  dicyclomine (BENTYL) 20 MG tablet Take 1 tablet (20 mg total) by mouth every 6 (six) hours as needed for spasms (abdominal cramping). 03/06/18   Samuel JesterMcManus, Kathleen, DO  famotidine (PEPCID) 20 MG tablet Take 1 tablet (20 mg total) by mouth daily. Patient not taking: Reported on 09/08/2018 03/06/18   Samuel JesterMcManus, Kathleen, DO  fluticasone (CUTIVATE) 0.05 % cream Apply 1 application topically 2 (two) times daily as needed (for psoriasis).  02/10/18   [provider]  hydrOXYzine (ATARAX/VISTARIL) 25 MG tablet Take one tablet by mouth q 4 hours prn itching Patient not taking: Reported on 09/08/2018 07/21/18   Babs SciaraLuking, Scott A, MD  pantoprazole (PROTONIX) 40 MG tablet Take 1 tablet (40 mg total) by mouth daily. 03/21/18   Babs SciaraLuking, Scott A, MD  sertraline (ZOLOFT) 100 MG tablet TAKE 1 TABLET BY MOUTH DAILY 07/18/18   Babs SciaraLuking, Scott A, MD  sildenafil (VIAGRA) 100 MG tablet Take 0.5-1 tablets (50-100 mg total) by mouth daily as needed for erectile dysfunction. Patient not taking: Reported on 07/21/2018 05/24/18   Babs SciaraLuking, Scott A, MD  sucralfate (  CARAFATE) 1 GM/10ML suspension Take 10 mLs (1 g total) by mouth 4 (four) times daily -  with meals and at bedtime. Patient not taking: Reported on 05/24/2018 02/21/18   Kathyrn Drown, MD    Family History Family History  Problem Relation Age of Onset   Diabetes Father    Hypertension Father    Colon cancer Neg Hx    Stomach cancer Neg Hx    Colon polyps  Neg Hx     Social History Social History   Tobacco Use   Smoking status: Never Smoker   Smokeless tobacco: Never Used  Substance Use Topics   Alcohol use: Not Currently    Alcohol/week: 0.0 standard drinks    Comment: rarely, "once in a blue moon"   Drug use: No     Allergies   Patient has no known allergies.   Review of Systems Review of Systems  Constitutional: Positive for chills and fatigue. Negative for fever.  HENT: Positive for congestion, ear pain and sore throat ("hoarseness).   Eyes: Negative for visual disturbance.  Respiratory: Positive for cough and shortness of breath.   Cardiovascular: Positive for chest pain. Negative for palpitations and leg swelling.  Gastrointestinal: Positive for abdominal pain. Negative for blood in stool, constipation, diarrhea, nausea and vomiting.  Neurological: Positive for headaches. Negative for dizziness, syncope, weakness and numbness.  All other systems reviewed and are negative.    Physical Exam Updated Vital Signs BP (!) 161/104 (BP Location: Right Arm)    Pulse 97    Temp 98.4 F (36.9 C) (Oral)    Resp 18    Ht 6\' 3"  (1.905 m)    Wt (!) 154.2 kg    SpO2 99%    BMI 42.50 kg/m   Physical Exam Vitals signs and nursing note reviewed.  Constitutional:      General: He is not in acute distress.    Appearance: He is well-developed. He is obese. He is not toxic-appearing.  HENT:     Head: Normocephalic and atraumatic.     Right Ear: Tympanic membrane, ear canal and external ear normal.     Left Ear: Tympanic membrane, ear canal and external ear normal.     Ears:     Comments: No mastoid erythema/swelling/tenderness.    Nose: Congestion present.     Comments: No sinus tenderness.     Mouth/Throat:     Pharynx: Oropharynx is clear. No oropharyngeal exudate or posterior oropharyngeal erythema.     Comments: Posterior oropharynx is symmetric appearing. Patient tolerating own secretions without difficulty. No trismus. No  drooling. No hot potato voice. No swelling beneath the tongue, submandibular compartment is soft.  Eyes:     General:        Right eye: No discharge.        Left eye: No discharge.     Extraocular Movements: Extraocular movements intact.     Conjunctiva/sclera: Conjunctivae normal.     Pupils: Pupils are equal, round, and reactive to light.  Neck:     Musculoskeletal: Neck supple. No neck rigidity.  Cardiovascular:     Rate and Rhythm: Normal rate and regular rhythm.  Pulmonary:     Effort: Pulmonary effort is normal. No respiratory distress.     Breath sounds: Normal breath sounds. No stridor. No wheezing, rhonchi or rales.  Chest:     Chest wall: No tenderness.  Abdominal:     General: There is no distension.     Palpations: Abdomen  is soft.     Tenderness: There is no abdominal tenderness. There is no guarding or rebound.  Musculoskeletal:        General: No tenderness.     Right lower leg: No edema.     Left lower leg: No edema.  Lymphadenopathy:     Cervical: No cervical adenopathy.  Skin:    General: Skin is warm and dry.     Findings: No rash.  Neurological:     General: No focal deficit present.     Mental Status: He is alert.     Comments: Clear speech. CN III-XII grossly intact. Sensation grossly intact x 4. 5/5 symmetric grip strength. 5/5 strength with ankle plantar/dorsiflexion bilaterally.   Psychiatric:        Behavior: Behavior normal.    ED Treatments / Results  Labs (all labs ordered are listed, but only abnormal results are displayed) Labs Reviewed  CBC - Abnormal; Notable for the following components:      Result Value   WBC 13.6 (*)    All other components within normal limits  COMPREHENSIVE METABOLIC PANEL  LIPASE, BLOOD    EKG EKG Interpretation  Date: 09/11/18   Ventricular Rate: 84 bpm   PR Interval: 210 ms   QRS Duration: 96 ms   QT Interval: 362 ms   QTC Calculation: 427 ms   R Axis:  Normal   Text Interpretation: sinus rhythm  with 1st degree AV block, no STEMI, no ST/Twave changes    Radiology Dg Chest Port 1 View  Result Date: 09/11/2018 CLINICAL DATA:  Body aches.  Shortness of breath. EXAM: PORTABLE CHEST 1 VIEW COMPARISON:  March 06, 2018 FINDINGS: The heart size and mediastinal contours are within normal limits. Both lungs are clear. The visualized skeletal structures are unremarkable. IMPRESSION: No active disease. Electronically Signed   By: Gerome Samavid  Williams III M.D   On: 09/11/2018 18:14    Procedures Procedures (including critical care time)  Medications Ordered in ED Medications  diphenhydrAMINE (BENADRYL) injection 12.5 mg (has no administration in time range)  metoCLOPramide (REGLAN) injection 10 mg (has no administration in time range)  dexamethasone (DECADRON) injection 10 mg (has no administration in time range)  albuterol (VENTOLIN HFA) 108 (90 Base) MCG/ACT inhaler 2 puff (has no administration in time range)     Initial Impression / Assessment and Plan / ED Course  I have reviewed the triage vital signs and the nursing notes.  Pertinent labs & imaging results that were available during my care of the patient were reviewed by me and considered in my medical decision making (see chart for details).   Patient presents to the ED w/ multiple complaints including HA, URI sxs, respiratory sxs, chest pain, & abdominal pain. Patient is nontoxic appearing, in no apparent distress, vitals WNL with the exception of elevated BP- doubt HTN emergency. Exam is fairly benign. No signs of AOM/AOE/mastoiditis. Centor 0 doubt strep. No sinus tenderness or fever to suggest acute bacterial sinusitis. No meningismus. Lungs CTA, CXR - for infiltrate- doubt bacterial pneumonia. CXR also w/o pneumothorax, effusion, or edema. PERC negative- doubt PE. EKG without ischemic changes to suggest ACS. Abdomen non tender w/o peritoneal signs- doubt acute surgical cause of abdominal discomfort. Labs WNL other than mildly  elevated WBC count which is felt to be nonspecific- no renal/liver dysfunction, significant electrolyte derangement, or anemia. No headache red flags. Given symptomatic care in the ED with very mild improvement. He has a COVID test pending by PCP  therefore do not feel that repeating this is necessary. Overall unclear definitive etiology, suspect viral, encouraged fluids, supportive measures prescribed. PCP follow up. I discussed results, treatment plan, need for follow-up, and return precautions with the patient & his wife @ bedside. Provided opportunity for questions, patient & his wife confirmed understanding and are in agreement with plan.   Donald [name] was evaluated in Emergency Department on 09/11/2018 for the symptoms described in the history of present illness. He/she was evaluated in the context of the global COVID-19 pandemic, which necessitated consideration that the patient might be at risk for infection with the SARS-CoV-2 virus that causes COVID-19. Institutional protocols and algorithms that pertain to the evaluation of patients at risk for COVID-19 are in a state of rapid change based on information released by regulatory bodies including the CDC and federal and state organizations. These policies and algorithms were followed during the patient's care in the ED.  Findings and plan of care discussed with supervising physician Dr. Adriana Simas who has evaluated patient & is in agreement.   Final Clinical Impressions(s) / ED Diagnoses   Final diagnoses:  Viral illness    ED Discharge Orders         Ordered    naproxen (NAPROSYN) 500 MG tablet  2 times daily     09/11/18 2051    fluticasone (FLONASE) 50 MCG/ACT nasal spray  Daily     09/11/18 2051           Desmond Lope 09/11/18 2053    Donnetta Hutching, MD 09/14/18 1446

## 2018-09-11 NOTE — Discharge Instructions (Signed)
You were seen in the emergency department today for generalized body aches, chills, respiratory symptoms, and GI symptoms.  Your work-up was overall reassuring.  Your chest x-ray was normal.  Your labs were all normal with the exception of your white blood cell count being mildly elevated.  Your EKG did not show signs of a heart attack, it did show a first-degree AV block.  Please discuss your EKG findings and elevated white blood cell count with your primary care provider, have these each rechecked within 1 to 2 weeks.  We are unsure the exact cause of your symptoms, we suspect this may be a virus, please be sure to follow-up on your coronavirus test.  We are sending you home with Flonase to use 1 spray per nostril daily as needed for congestion, naproxen for pain (detailed below), and an inhaler to use 1 to 2 puffs every 4-6 hours as needed for trouble breathing.  - Naproxen is a nonsteroidal anti-inflammatory medication that will help with pain and swelling. Be sure to take this medication as prescribed with food, 1 pill every 12 hours,  It should be taken with food, as it can cause stomach upset, and more seriously, stomach bleeding. Do not take other nonsteroidal anti-inflammatory medications with this such as Advil, Motrin, Aleve, Mobic, Goodie Powder, or Motrin.    You make take Tylenol per over the counter dosing with these medications.   We have prescribed you new medication(s) today. Discuss the medications prescribed today with your pharmacist as they can have adverse effects and interactions with your other medicines including over the counter and prescribed medications. Seek medical evaluation if you start to experience new or abnormal symptoms after taking one of these medicines, seek care immediately if you start to experience difficulty breathing, feeling of your throat closing, facial swelling, or rash as these could be indications of a more serious allergic reaction   Please  follow-up with primary care within 3 days.  Return to the ER for new or worsening symptoms or any other concerns.

## 2018-09-12 ENCOUNTER — Ambulatory Visit: Payer: Commercial Managed Care - PPO | Admitting: Gastroenterology

## 2018-09-13 ENCOUNTER — Telehealth: Payer: Self-pay | Admitting: Family Medicine

## 2018-09-13 NOTE — Telephone Encounter (Signed)
Please advise. Thank you

## 2018-09-13 NOTE — Telephone Encounter (Signed)
Patient wanting to return to work on 9/3 instead of 9/4 feeling a lot better.

## 2018-09-13 NOTE — Telephone Encounter (Signed)
Patient may have a work note to return to work 09/15/2018 please assist with giving this note

## 2018-09-13 NOTE — Telephone Encounter (Signed)
Work excuse already in the system

## 2018-09-15 ENCOUNTER — Other Ambulatory Visit: Payer: Self-pay

## 2018-09-15 ENCOUNTER — Ambulatory Visit (INDEPENDENT_AMBULATORY_CARE_PROVIDER_SITE_OTHER): Payer: Commercial Managed Care - PPO | Admitting: Family Medicine

## 2018-09-15 DIAGNOSIS — I440 Atrioventricular block, first degree: Secondary | ICD-10-CM

## 2018-09-15 DIAGNOSIS — R030 Elevated blood-pressure reading, without diagnosis of hypertension: Secondary | ICD-10-CM

## 2018-09-15 NOTE — Progress Notes (Signed)
   Subjective:    Patient ID: Donald [name], male    DOB: [birth year], 33 y.o.   MRN: [id number]  HPI  Patient calls to discuss recent ER visit. Patient went to ER for SOB and body aches and was told he had a AV blockage. Patient states he was told it was nothing to worry about but he has questions and concerns. Patient was in the ER for evaluation was having various health issues regarding fatigue tiredness shortness of breath had thorough evaluation with chest x-ray blood pressure was elevated EKG did show first-degree AV block patient has some questions regarding all of this denies any other particular troubles Virtual Visit via Video Note  I connected with Donald [name] on 09/15/18 at 11:00 AM EDT by a video enabled telemedicine application and verified that I am speaking with the correct person using two identifiers.  Location: Patient: home Provider: office    I discussed the limitations of evaluation and management by telemedicine and the availability of in person appointments. The patient expressed understanding and agreed to proceed.  History of Present Illness:    Observations/Objective:   Assessment and Plan:   Follow Up Instructions:    I discussed the assessment and treatment plan with the patient. The patient was provided an opportunity to ask questions and all were answered. The patient agreed with the plan and demonstrated an understanding of the instructions.   The patient was advised to call back or seek an in-person evaluation if the symptoms worsen or if the condition fails to improve as anticipated.  I provided    15  minutes of non-face-to-face time during this encounter.      Review of Systems  Constitutional: Negative for activity change.  HENT: Negative for congestion and rhinorrhea.   Respiratory: Negative for cough and shortness of breath.   Cardiovascular: Negative for chest pain, palpitations and leg swelling.  Gastrointestinal: Negative  for abdominal pain, diarrhea, nausea and vomiting.  Genitourinary: Negative for dysuria and hematuria.  Neurological: Negative for weakness and headaches.  Psychiatric/Behavioral: Negative for behavioral problems and confusion.       Objective:   Physical Exam  Patient had virtual visit Appears to be in no distress Atraumatic Neuro able to relate and oriented No apparent resp distress Color normal       Assessment & Plan:  Recent viral illness patient is getting over this he is back at work I have encouraged him to pace himself and do a good job and get plenty of liquids in his diet and eating properly  Patient also with fatigue and tiredness which will gradually resolve  First-degree AV block on recent EKG should not cause any serious trouble but this is something that were discussed further when he comes next week  Elevated blood pressure he will come next Wednesday at 4:00 in order to be rechecked

## 2018-09-21 ENCOUNTER — Other Ambulatory Visit: Payer: Self-pay

## 2018-09-21 ENCOUNTER — Ambulatory Visit (INDEPENDENT_AMBULATORY_CARE_PROVIDER_SITE_OTHER): Payer: Commercial Managed Care - PPO | Admitting: Family Medicine

## 2018-09-21 ENCOUNTER — Encounter: Payer: Self-pay | Admitting: Family Medicine

## 2018-09-21 VITALS — BP 142/90 | Temp 98.4°F | Ht 75.0 in | Wt 345.0 lb

## 2018-09-21 DIAGNOSIS — I10 Essential (primary) hypertension: Secondary | ICD-10-CM

## 2018-09-21 MED ORDER — LISINOPRIL 2.5 MG PO TABS: 2.5000 mg | ORAL_TABLET | Freq: Every day | ORAL | 5 refills | Status: DC

## 2018-09-21 NOTE — Patient Instructions (Signed)
Hypertension, Adult High blood pressure (hypertension) is when the force of blood pumping through the arteries is too strong. The arteries are the blood vessels that carry blood from the heart throughout the body. Hypertension forces the heart to work harder to pump blood and may cause arteries to become narrow or stiff. Untreated or uncontrolled hypertension can cause a heart attack, heart failure, a stroke, kidney disease, and other problems. A blood pressure reading consists of a higher number over a lower number. Ideally, your blood pressure should be below 120/80. The first ("top") number is called the systolic pressure. It is a measure of the pressure in your arteries as your heart beats. The second ("bottom") number is called the diastolic pressure. It is a measure of the pressure in your arteries as the heart relaxes. What are the causes? The exact cause of this condition is not known. There are some conditions that result in or are related to high blood pressure. What increases the risk? Some risk factors for high blood pressure are under your control. The following factors may make you more likely to develop this condition:  Smoking.  Having type 2 diabetes mellitus, high cholesterol, or both.  Not getting enough exercise or physical activity.  Being overweight.  Having too much fat, sugar, calories, or salt (sodium) in your diet.  Drinking too much alcohol. Some risk factors for high blood pressure may be difficult or impossible to change. Some of these factors include:  Having chronic kidney disease.  Having a family history of high blood pressure.  Age. Risk increases with age.  Race. You may be at higher risk if you are African American.  Gender. Men are at higher risk than women before age 45. After age 65, women are at higher risk than men.  Having obstructive sleep apnea.  Stress. What are the signs or symptoms? High blood pressure may not cause symptoms. Very high  blood pressure (hypertensive crisis) may cause:  Headache.  Anxiety.  Shortness of breath.  Nosebleed.  Nausea and vomiting.  Vision changes.  Severe chest pain.  Seizures. How is this diagnosed? This condition is diagnosed by measuring your blood pressure while you are seated, with your arm resting on a flat surface, your legs uncrossed, and your feet flat on the floor. The cuff of the blood pressure monitor will be placed directly against the skin of your upper arm at the level of your heart. It should be measured at least twice using the same arm. Certain conditions can cause a difference in blood pressure between your right and left arms. Certain factors can cause blood pressure readings to be lower or higher than normal for a short period of time:  When your blood pressure is higher when you are in a health care provider's office than when you are at home, this is called white coat hypertension. Most people with this condition do not need medicines.  When your blood pressure is higher at home than when you are in a health care provider's office, this is called masked hypertension. Most people with this condition may need medicines to control blood pressure. If you have a high blood pressure reading during one visit or you have normal blood pressure with other risk factors, you may be asked to:  Return on a different day to have your blood pressure checked again.  Monitor your blood pressure at home for 1 week or longer. If you are diagnosed with hypertension, you may have other blood or   imaging tests to help your health care provider understand your overall risk for other conditions. How is this treated? This condition is treated by making healthy lifestyle changes, such as eating healthy foods, exercising more, and reducing your alcohol intake. Your health care provider may prescribe medicine if lifestyle changes are not enough to get your blood pressure under control, and if:   Your systolic blood pressure is above 130.  Your diastolic blood pressure is above 80. Your personal target blood pressure may vary depending on your medical conditions, your age, and other factors. Follow these instructions at home: Eating and drinking   Eat a diet that is high in fiber and potassium, and low in sodium, added sugar, and fat. An example eating plan is called the DASH (Dietary Approaches to Stop Hypertension) diet. To eat this way: ? Eat plenty of fresh fruits and vegetables. Try to fill one half of your plate at each meal with fruits and vegetables. ? Eat whole grains, such as whole-wheat pasta, brown rice, or whole-grain bread. Fill about one fourth of your plate with whole grains. ? Eat or drink low-fat dairy products, such as skim milk or low-fat yogurt. ? Avoid fatty cuts of meat, processed or cured meats, and poultry with skin. Fill about one fourth of your plate with lean proteins, such as fish, chicken without skin, beans, eggs, or tofu. ? Avoid pre-made and processed foods. These tend to be higher in sodium, added sugar, and fat.  Reduce your daily sodium intake. Most people with hypertension should eat less than 1,500 mg of sodium a day.  Do not drink alcohol if: ? Your health care provider tells you not to drink. ? You are pregnant, may be pregnant, or are planning to become pregnant.  If you drink alcohol: ? Limit how much you use to:  0-1 drink a day for women.  0-2 drinks a day for men. ? Be aware of how much alcohol is in your drink. In the [place] one drink equals one 12 oz bottle of beer (355 mL), one 5 oz glass of wine (148 mL), or one 1 oz glass of hard liquor (44 mL). Lifestyle   Work with your health care provider to maintain a healthy body weight or to lose weight. Ask what an ideal weight is for you.  Get at least 30 minutes of exercise most days of the week. Activities may include walking, swimming, or biking.  Include exercise to strengthen  your muscles (resistance exercise), such as Pilates or lifting weights, as part of your weekly exercise routine. Try to do these types of exercises for 30 minutes at least 3 days a week.  Do not use any products that contain nicotine or tobacco, such as cigarettes, e-cigarettes, and chewing tobacco. If you need help quitting, ask your health care provider.  Monitor your blood pressure at home as told by your health care provider.  Keep all follow-up visits as told by your health care provider. This is important. Medicines  Take over-the-counter and prescription medicines only as told by your health care provider. Follow directions carefully. Blood pressure medicines must be taken as prescribed.  Do not skip doses of blood pressure medicine. Doing this puts you at risk for problems and can make the medicine less effective.  Ask your health care provider about side effects or reactions to medicines that you should watch for. Contact a health care provider if you:  Think you are having a reaction to a medicine you   are taking.  Have headaches that keep coming back (recurring).  Feel dizzy.  Have swelling in your ankles.  Have trouble with your vision. Get help right away if you:  Develop a severe headache or confusion.  Have unusual weakness or numbness.  Feel faint.  Have severe pain in your chest or abdomen.  Vomit repeatedly.  Have trouble breathing. Summary  Hypertension is when the force of blood pumping through your arteries is too strong. If this condition is not controlled, it may put you at risk for serious complications.  Your personal target blood pressure may vary depending on your medical conditions, your age, and other factors. For most people, a normal blood pressure is less than 120/80.  Hypertension is treated with lifestyle changes, medicines, or a combination of both. Lifestyle changes include losing weight, eating a healthy, low-sodium diet, exercising  more, and limiting alcohol. This information is not intended to replace advice given to you by your health care provider. Make sure you discuss any questions you have with your health care provider. Document Released: 12/29/2004 Document Revised: 09/08/2017 Document Reviewed: 09/08/2017 Elsevier Patient Education  2020 [employer]  DASH Eating Plan DASH stands for "Dietary Approaches to Stop Hypertension." The DASH eating plan is a healthy eating plan that has been shown to reduce high blood pressure (hypertension). It may also reduce your risk for type 2 diabetes, heart disease, and stroke. The DASH eating plan may also help with weight loss. What are tips for following this plan?  General guidelines  Avoid eating more than 2,300 mg (milligrams) of salt (sodium) a day. If you have hypertension, you may need to reduce your sodium intake to 1,500 mg a day.  Limit alcohol intake to no more than 1 drink a day for nonpregnant women and 2 drinks a day for men. One drink equals 12 oz of beer, 5 oz of wine, or 1 oz of hard liquor.  Work with your health care provider to maintain a healthy body weight or to lose weight. Ask what an ideal weight is for you.  Get at least 30 minutes of exercise that causes your heart to beat faster (aerobic exercise) most days of the week. Activities may include walking, swimming, or biking.  Work with your health care provider or diet and nutrition specialist (dietitian) to adjust your eating plan to your individual calorie needs. Reading food labels   Check food labels for the amount of sodium per serving. Choose foods with less than 5 percent of the Daily Value of sodium. Generally, foods with less than 300 mg of sodium per serving fit into this eating plan.  To find whole grains, look for the word "whole" as the first word in the ingredient list. Shopping  Buy products labeled as "low-sodium" or "no salt added."  Buy fresh foods. Avoid canned foods and  premade or frozen meals. Cooking  Avoid adding salt when cooking. Use salt-free seasonings or herbs instead of table salt or sea salt. Check with your health care provider or pharmacist before using salt substitutes.  Do not fry foods. Cook foods using healthy methods such as baking, boiling, grilling, and broiling instead.  Cook with heart-healthy oils, such as olive, canola, soybean, or sunflower oil. Meal planning  Eat a balanced diet that includes: ? 5 or more servings of fruits and vegetables each day. At each meal, try to fill half of your plate with fruits and vegetables. ? Up to 6-8 servings of whole grains each day. ?   Less than 6 oz of lean meat, poultry, or fish each day. A 3-oz serving of meat is about the same size as a deck of cards. One egg equals 1 oz. ? 2 servings of low-fat dairy each day. ? A serving of nuts, seeds, or beans 5 times each week. ? Heart-healthy fats. Healthy fats called Omega-3 fatty acids are found in foods such as flaxseeds and coldwater fish, like sardines, salmon, and mackerel.  Limit how much you eat of the following: ? Canned or prepackaged foods. ? Food that is high in trans fat, such as fried foods. ? Food that is high in saturated fat, such as fatty meat. ? Sweets, desserts, sugary drinks, and other foods with added sugar. ? Full-fat dairy products.  Do not salt foods before eating.  Try to eat at least 2 vegetarian meals each week.  Eat more home-cooked food and less restaurant, buffet, and fast food.  When eating at a restaurant, ask that your food be prepared with less salt or no salt, if possible. What foods are recommended? The items listed may not be a complete list. Talk with your dietitian about what dietary choices are best for you. Grains Whole-grain or whole-wheat bread. Whole-grain or whole-wheat pasta. Brown rice. Oatmeal. Quinoa. Bulgur. Whole-grain and low-sodium cereals. Pita bread. Low-fat, low-sodium crackers. Whole-wheat  flour tortillas. Vegetables Fresh or frozen vegetables (raw, steamed, roasted, or grilled). Low-sodium or reduced-sodium tomato and vegetable juice. Low-sodium or reduced-sodium tomato sauce and tomato paste. Low-sodium or reduced-sodium canned vegetables. Fruits All fresh, dried, or frozen fruit. Canned fruit in natural juice (without added sugar). Meat and other protein foods Skinless chicken or turkey. Ground chicken or turkey. Pork with fat trimmed off. Fish and seafood. Egg whites. Dried beans, peas, or lentils. Unsalted nuts, nut butters, and seeds. Unsalted canned beans. Lean cuts of beef with fat trimmed off. Low-sodium, lean deli meat. Dairy Low-fat (1%) or fat-free (skim) milk. Fat-free, low-fat, or reduced-fat cheeses. Nonfat, low-sodium ricotta or cottage cheese. Low-fat or nonfat yogurt. Low-fat, low-sodium cheese. Fats and oils Soft margarine without trans fats. Vegetable oil. Low-fat, reduced-fat, or light mayonnaise and salad dressings (reduced-sodium). Canola, safflower, olive, soybean, and sunflower oils. Avocado. Seasoning and other foods Herbs. Spices. Seasoning mixes without salt. Unsalted popcorn and pretzels. Fat-free sweets. What foods are not recommended? The items listed may not be a complete list. Talk with your dietitian about what dietary choices are best for you. Grains Baked goods made with fat, such as croissants, muffins, or some breads. Dry pasta or rice meal packs. Vegetables Creamed or fried vegetables. Vegetables in a cheese sauce. Regular canned vegetables (not low-sodium or reduced-sodium). Regular canned tomato sauce and paste (not low-sodium or reduced-sodium). Regular tomato and vegetable juice (not low-sodium or reduced-sodium). Pickles. Olives. Fruits Canned fruit in a light or heavy syrup. Fried fruit. Fruit in cream or butter sauce. Meat and other protein foods Fatty cuts of meat. Ribs. Fried meat. Bacon. Sausage. Bologna and other processed lunch  meats. Salami. Fatback. Hotdogs. Bratwurst. Salted nuts and seeds. Canned beans with added salt. Canned or smoked fish. Whole eggs or egg yolks. Chicken or turkey with skin. Dairy Whole or 2% milk, cream, and half-and-half. Whole or full-fat cream cheese. Whole-fat or sweetened yogurt. Full-fat cheese. Nondairy creamers. Whipped toppings. Processed cheese and cheese spreads. Fats and oils Butter. Stick margarine. Lard. Shortening. Ghee. Bacon fat. Tropical oils, such as coconut, palm kernel, or palm oil. Seasoning and other foods Salted popcorn and pretzels. Onion salt,   garlic salt, seasoned salt, table salt, and sea salt. Worcestershire sauce. Tartar sauce. Barbecue sauce. Teriyaki sauce. Soy sauce, including reduced-sodium. Steak sauce. Canned and packaged gravies. Fish sauce. Oyster sauce. Cocktail sauce. Horseradish that you find on the shelf. Ketchup. Mustard. Meat flavorings and tenderizers. Bouillon cubes. Hot sauce and Tabasco sauce. Premade or packaged marinades. Premade or packaged taco seasonings. Relishes. Regular salad dressings. Where to find more information:  National Heart, Lung, and Blood Institute: www.nhlbi.nih.gov  American Heart Association: www.heart.org Summary  The DASH eating plan is a healthy eating plan that has been shown to reduce high blood pressure (hypertension). It may also reduce your risk for type 2 diabetes, heart disease, and stroke.  With the DASH eating plan, you should limit salt (sodium) intake to 2,300 mg a day. If you have hypertension, you may need to reduce your sodium intake to 1,500 mg a day.  When on the DASH eating plan, aim to eat more fresh fruits and vegetables, whole grains, lean proteins, low-fat dairy, and heart-healthy fats.  Work with your health care provider or diet and nutrition specialist (dietitian) to adjust your eating plan to your individual calorie needs. This information is not intended to replace advice given to you by your  health care provider. Make sure you discuss any questions you have with your health care provider. Document Released: 12/18/2010 Document Revised: 12/11/2016 Document Reviewed: 12/23/2015 Elsevier Patient Education  2020 Elsevier Inc.  

## 2018-09-21 NOTE — Progress Notes (Signed)
   Subjective:    Patient ID: Donald [name], male    DOB: 11-16-1985, 33 y.o.   MRN: [id number]  HPIFollow up on elevated blood pressure. No regular exercise but walks a lot at work. Tries to eat healthy.  Patient understands importance of trying to exercise more he understands importance of minimizing salt in the diet trying to eat healthier try to lose weight patient has strong family history of high blood pressure.  He denies fevers chills sweats chest pressure tightness pain shortness of breath swelling in the legs PMH benign Pt states no concerns today.     Review of Systems  Constitutional: Negative for activity change, fatigue and fever.  HENT: Negative for congestion and rhinorrhea.   Respiratory: Negative for cough and shortness of breath.   Cardiovascular: Negative for chest pain, palpitations and leg swelling.  Gastrointestinal: Negative for abdominal pain, diarrhea and nausea.  Genitourinary: Negative for dysuria and hematuria.  Neurological: Negative for weakness and headaches.  Psychiatric/Behavioral: Negative for agitation and behavioral problems.       Objective:   Physical Exam Vitals signs reviewed.  Constitutional:      General: He is not in acute distress. HENT:     Head: Normocephalic and atraumatic.  Eyes:     General:        Right eye: No discharge.        Left eye: No discharge.  Neck:     Trachea: No tracheal deviation.  Cardiovascular:     Rate and Rhythm: Normal rate and regular rhythm.     Heart sounds: Normal heart sounds. No murmur.  Pulmonary:     Effort: Pulmonary effort is normal. No respiratory distress.     Breath sounds: Normal breath sounds. No wheezing.  Lymphadenopathy:     Cervical: No cervical adenopathy.  Skin:    General: Skin is warm.     Findings: No rash.  Neurological:     Mental Status: He is alert.  Psychiatric:        Behavior: Behavior normal.    Blood pressure check both arms large, best reading 136/92     Assessment & Plan:  HTN Minimize salt Watch portions Walk more Try to lose weight Initiate low-dose medication Follow-up in approximately 4 weeks follow-up sooner problems warning signs discussed in detail patient was told if he starts having swelling with medicine stop it notify us if he has dry cough with the medicine stop it notify us

## 2018-10-06 ENCOUNTER — Other Ambulatory Visit: Payer: Self-pay | Admitting: Family Medicine

## 2018-10-19 ENCOUNTER — Ambulatory Visit (INDEPENDENT_AMBULATORY_CARE_PROVIDER_SITE_OTHER): Payer: Commercial Managed Care - PPO | Admitting: Family Medicine

## 2018-10-19 ENCOUNTER — Encounter: Payer: Self-pay | Admitting: Family Medicine

## 2018-10-19 ENCOUNTER — Other Ambulatory Visit: Payer: Self-pay

## 2018-10-19 VITALS — BP 118/84 | Temp 97.6°F | Ht 75.0 in | Wt 353.0 lb

## 2018-10-19 DIAGNOSIS — I10 Essential (primary) hypertension: Secondary | ICD-10-CM | POA: Diagnosis not present

## 2018-10-19 DIAGNOSIS — Z23 Encounter for immunization: Secondary | ICD-10-CM | POA: Diagnosis not present

## 2018-10-19 HISTORY — DX: Essential (primary) hypertension: I10

## 2018-10-19 NOTE — Progress Notes (Signed)
   Subjective:    Patient ID: Donald [name], male    DOB: 1985/07/06, 33 y.o.   MRN: [id number]  Hypertension This is a chronic problem. Pertinent negatives include no chest pain, headaches or shortness of breath. Treatments tried: lisinopril. There are no compliance problems (takes med every day, walks all day at work for exercise).    He he comes in today for follow-up.  Patient does suffer with morbid obesity.  He is trying to watch his diet trying to cut portions he is trying to stay active his work keeps him very busy but he is hoping that he can lose some weight by doing some walking on the weekends   Review of Systems  Constitutional: Negative for diaphoresis and fatigue.  HENT: Negative for congestion and rhinorrhea.   Respiratory: Negative for cough and shortness of breath.   Cardiovascular: Negative for chest pain and leg swelling.  Gastrointestinal: Negative for abdominal pain and diarrhea.  Skin: Negative for color change and rash.  Neurological: Negative for dizziness and headaches.  Psychiatric/Behavioral: Negative for behavioral problems and confusion.       Objective:   Physical Exam Vitals signs reviewed.  Constitutional:      General: He is not in acute distress. HENT:     Head: Normocephalic and atraumatic.  Eyes:     General:        Right eye: No discharge.        Left eye: No discharge.  Neck:     Trachea: No tracheal deviation.  Cardiovascular:     Rate and Rhythm: Normal rate and regular rhythm.     Heart sounds: Normal heart sounds. No murmur.  Pulmonary:     Effort: Pulmonary effort is normal. No respiratory distress.     Breath sounds: Normal breath sounds.  Lymphadenopathy:     Cervical: No cervical adenopathy.  Skin:    General: Skin is warm and dry.  Neurological:     Mental Status: He is alert.     Coordination: Coordination normal.  Psychiatric:        Behavior: Behavior normal.           Assessment & Plan:  Morbid obesity  watch weight stay active try to lose weight  HTN- Patient was seen today as part of a visit regarding hypertension. The importance of healthy diet and regular physical activity was discussed. The importance of compliance with medications discussed.  Ideal goal is to keep blood pressure low elevated levels certainly below 409/81 when possible.  The patient was counseled that keeping blood pressure under control lessen his risk of complications.  The importance of regular follow-ups was discussed with the patient.  Low-salt diet such as DASH recommended.  Regular physical activity was recommended as well.  Patient was advised to keep regular follow-ups. Blood pressure good control taken his medicines follow-up 6 months

## 2019-02-03 ENCOUNTER — Other Ambulatory Visit: Payer: Self-pay | Admitting: Family Medicine

## 2019-02-03 NOTE — Telephone Encounter (Signed)
May have this +2 refills needs a virtual visit this spring

## 2019-02-06 NOTE — Telephone Encounter (Signed)
Please schedule and then route back to nurses to send in refill °

## 2019-02-06 NOTE — Telephone Encounter (Signed)
Patient scheduled 3 month medication follow up 3/16

## 2019-02-06 NOTE — Telephone Encounter (Signed)
lvm to schedule virtual visit in March 

## 2019-02-18 ENCOUNTER — Ambulatory Visit
Admission: EM | Admit: 2019-02-18 | Discharge: 2019-02-18 | Disposition: A | Discharge status: Home / Self Care | Payer: Commercial Managed Care - PPO | Attending: Family Medicine | Admitting: Family Medicine

## 2019-02-18 ENCOUNTER — Other Ambulatory Visit: Payer: Self-pay

## 2019-02-18 DIAGNOSIS — K122 Cellulitis and abscess of mouth: Secondary | ICD-10-CM

## 2019-02-18 MED ORDER — PREDNISONE 10 MG (21) PO TBPK: ORAL_TABLET | Freq: Every day | ORAL | 0 refills | Status: AC

## 2019-02-18 MED ORDER — METHYLPREDNISOLONE ACETATE 80 MG/ML IJ SUSP
80.0000 mg | Freq: Once | INTRAMUSCULAR | Status: AC
  Administered 2019-02-18: 09:00:00 80 mg via INTRAMUSCULAR

## 2019-02-18 NOTE — ED Triage Notes (Signed)
Pt awoken this morning with swollen uvula , states that it isn't painful l but feels as enlarged and difficult to swallow

## 2019-02-18 NOTE — Discharge Instructions (Addendum)
Your uvula is swollen. You have received a steroid injection in the office to help decrease the swelling. You also have a prescription waiting at [facility] for prednisone. Take this medication as directed on the package. Steroids can increase your BP, cause increased hunger and thirst, weight gain, and some irritability.  Go to the Emergency Department if you are having trouble breathing, notice that your neck is swelling, having any tongue swelling, or other concerning symptoms.

## 2019-02-18 NOTE — ED Provider Notes (Addendum)
[facility]    CSN: [id number] Arrival date & time: 02/18/19  6213      History   Chief Complaint Chief Complaint  Patient presents with  . Sore Throat    HPI Donald [name] is a 34 y.o. male.   Reports that his throat is swelling and feels like there is something back there that he is choking on. Denies SOB, sore throat, airway obstruction. Denies ever having these symptoms before. Denies any attempts to treat at home. Reports that he had pizza for dinner last night and that he woke up feeling this way. Denies any known food or other allergies.   The history is provided by the patient.  Sore Throat Pertinent negatives include no chest pain, no abdominal pain and no shortness of breath.    Past Medical History:  Diagnosis Date  . Anxiety   . Chronic abdominal pain   . Depression   . Dyspepsia   . HTN (hypertension) 10/19/2018  . Morbid obesity ([place]) 05/10/2017  . Pinched nerve    in his back    Patient Active Problem List   Diagnosis Date Noted  . HTN (hypertension) 10/19/2018  . First degree AV block 09/15/2018  . GERD (gastroesophageal reflux disease) 05/24/2018  . Melena 02/25/2018  . Morbid obesity ([place]) 05/10/2017  . GAD (generalized anxiety disorder) 10/09/2016  . Dyspepsia 03/20/2014    Past Surgical History:  Procedure Laterality Date  . APPENDECTOMY     July 2019  . ESOPHAGOGASTRODUODENOSCOPY N/A 04/02/2014   RMR: Very small hiatial hernia. Gastric polyps status post biopsy. No endoscopic explanation for patient's symptoms. Bengin fundic gland polyp. Negative H.pylori  . ESOPHAGOGASTRODUODENOSCOPY (EGD) WITH PROPOFOL N/A 03/03/2018   Procedure: ESOPHAGOGASTRODUODENOSCOPY (EGD) WITH PROPOFOL;  Surgeon: Daneil Dolin, MD;  Location: [facility];  Service: Endoscopy;  Laterality: N/A;  12:30pm       Home Medications    Prior to Admission medications   Medication Sig Start Date End Date Taking? Authorizing Provider  ALPRAZolam  Duanne Moron) 0.5 MG tablet 1/2 to 1 bid prn panic attacks 10/09/16   Kathyrn Drown, MD  augmented betamethasone dipropionate (DIPROLENE-AF) 0.05 % cream Apply 1 application topically 2 (two) times daily as needed (for psoriasis).  02/10/18   [provider]  dicyclomine (BENTYL) 20 MG tablet Take 1 tablet (20 mg total) by mouth every 6 (six) hours as needed for spasms (abdominal cramping). 03/06/18   Francine Graven, DO  famotidine (PEPCID) 20 MG tablet Take 1 tablet (20 mg total) by mouth daily. 03/06/18   Francine Graven, DO  fluticasone (CUTIVATE) 0.05 % cream Apply 1 application topically 2 (two) times daily as needed (for psoriasis).  02/10/18   [provider]  fluticasone (FLONASE) 50 MCG/ACT nasal spray Place 1 spray into both nostrils daily. 09/11/18   Petrucelli, Samantha R, PA-C  hydrOXYzine (ATARAX/VISTARIL) 25 MG tablet Take one tablet by mouth q 4 hours prn itching 07/21/18   Luking, Scott A, MD  lisinopril (ZESTRIL) 2.5 MG tablet Take 1 tablet (2.5 mg total) by mouth daily. 09/21/18   Kathyrn Drown, MD  pantoprazole (PROTONIX) 40 MG tablet TAKE 1 TABLET BY MOUTH EVERY DAY 10/06/18   Kathyrn Drown, MD  predniSONE (STERAPRED UNI-PAK 21 TAB) 10 MG (21) TBPK tablet Take by mouth daily for 6 days. Take 6 tablets on day 1, 5 tablets on day 2, 4 tablets on day 3, 3 tablets on day 4, 2 tablets on day 5, 1  tablet on day 6 02/18/19 02/24/19  Moshe Cipro, NP  sertraline (ZOLOFT) 100 MG tablet TAKE 1 TABLET BY MOUTH DAILY 02/06/19   Babs Sciara, MD  sildenafil (VIAGRA) 100 MG tablet Take 0.5-1 tablets (50-100 mg total) by mouth daily as needed for erectile dysfunction. 05/24/18   Babs Sciara, MD  sucralfate (CARAFATE) 1 GM/10ML suspension Take 10 mLs (1 g total) by mouth 4 (four) times daily -  with meals and at bedtime. 02/21/18   Babs Sciara, MD    Family History Family History  Problem Relation Age of Onset  . Diabetes Father   . Hypertension Father   . Colon  cancer Neg Hx   . Stomach cancer Neg Hx   . Colon polyps Neg Hx     Social History Social History   Tobacco Use  . Smoking status: Never Smoker  . Smokeless tobacco: Never Used  Substance Use Topics  . Alcohol use: Not Currently    Alcohol/week: 0.0 standard drinks    Comment: rarely, "once in a blue moon"  . Drug use: No     Allergies   Patient has no known allergies.   Review of Systems Review of Systems  Constitutional: Negative for chills, fatigue and fever.  HENT: Positive for trouble swallowing. Negative for sore throat.   Eyes: Negative for pain and visual disturbance.  Respiratory: Negative for cough and shortness of breath.   Cardiovascular: Negative for chest pain and palpitations.  Gastrointestinal: Negative for abdominal pain and vomiting.  Genitourinary: Negative for dysuria and hematuria.  Musculoskeletal: Negative for arthralgias and back pain.  Skin: Negative for color change and rash.  Neurological: Negative for seizures and syncope.  All other systems reviewed and are negative.    Physical Exam Triage Vital Signs ED Triage Vitals  Enc Vitals Group     BP 02/18/19 0824 (!) 152/91     Pulse Rate 02/18/19 0824 81     Resp 02/18/19 0824 20     Temp 02/18/19 0824 97.9 F (36.6 C)     Temp src --      SpO2 02/18/19 0824 96 %     Weight --      Height --      Head Circumference --      Peak Flow --      Pain Score 02/18/19 0823 5     Pain Loc --      Pain Edu? --      Excl. in GC? --    No data found.  Updated Vital Signs BP (!) 152/91   Pulse 81   Temp 97.9 F (36.6 C)   Resp 20   SpO2 96%     Physical Exam Vitals and nursing note reviewed.  Constitutional:      General: He is not in acute distress.    Appearance: He is well-developed. He is obese.  HENT:     Head: Normocephalic and atraumatic.     Right Ear: Tympanic membrane normal.     Left Ear: Tympanic membrane normal.     Mouth/Throat:     Mouth: Mucous membranes are  moist.     Pharynx: Uvula swelling present.     Tonsils: No tonsillar exudate or tonsillar abscesses.  Eyes:     Conjunctiva/sclera: Conjunctivae normal.  Cardiovascular:     Rate and Rhythm: Normal rate and regular rhythm.     Heart sounds: No murmur.  Pulmonary:     Effort: Pulmonary effort  is normal. No respiratory distress.     Breath sounds: Normal breath sounds. No stridor. No wheezing, rhonchi or rales.  Abdominal:     Palpations: Abdomen is soft.     Tenderness: There is no abdominal tenderness.  Musculoskeletal:     Cervical back: Neck supple.  Skin:    General: Skin is warm and dry.     Capillary Refill: Capillary refill takes less than 2 seconds.  Neurological:     General: No focal deficit present.     Mental Status: He is alert.  Psychiatric:        Mood and Affect: Mood normal.      UC Treatments / Results  Labs (all labs ordered are listed, but only abnormal results are displayed) Labs Reviewed - No data to display  EKG   Radiology No results found.  Procedures Procedures (including critical care time)  Medications Ordered in UC Medications  methylPREDNISolone acetate (DEPO-MEDROL) injection 80 mg (80 mg Intramuscular Given 02/18/19 0847)    Initial Impression / Assessment and Plan / UC Course  I have reviewed the triage vital signs and the nursing notes.  Pertinent labs & imaging results that were available during my care of the patient were reviewed by me and considered in my medical decision making (see chart for details).     Presents with uvula swelling. No acute distress. Given depomedrol IM in office. Rx predpack and instructed on use. Follow up with primary care provider. Instructed on when to go to the Emergency Department.  Final Clinical Impressions(s) / UC Diagnoses   Final diagnoses:  Uvulitis     Discharge Instructions     Your uvula is swollen. You have received a steroid injection in the office to help decrease the swelling.  You also have a prescription waiting at [facility] for prednisone. Take this medication as directed on the package. Steroids can increase your BP, cause increased hunger and thirst, weight gain, and some irritability.  Go to the Emergency Department if you are having trouble breathing, notice that your neck is swelling, having any tongue swelling, or other concerning symptoms.    ED Prescriptions    Medication Sig Dispense Auth. Provider   predniSONE (STERAPRED UNI-PAK 21 TAB) 10 MG (21) TBPK tablet Take by mouth daily for 6 days. Take 6 tablets on day 1, 5 tablets on day 2, 4 tablets on day 3, 3 tablets on day 4, 2 tablets on day 5, 1 tablet on day 6 21 tablet Moshe Cipro, NP     I have reviewed the PDMP during this encounter.   Moshe Cipro, NP 02/18/19 0901    Moshe Cipro, NP 02/18/19 8756

## 2019-03-11 ENCOUNTER — Other Ambulatory Visit: Payer: Self-pay | Admitting: Family Medicine

## 2019-03-28 ENCOUNTER — Ambulatory Visit (INDEPENDENT_AMBULATORY_CARE_PROVIDER_SITE_OTHER): Payer: Commercial Managed Care - PPO | Admitting: Family Medicine

## 2019-03-28 ENCOUNTER — Other Ambulatory Visit: Payer: Self-pay

## 2019-03-28 ENCOUNTER — Encounter: Payer: Self-pay | Admitting: Family Medicine

## 2019-03-28 DIAGNOSIS — K219 Gastro-esophageal reflux disease without esophagitis: Secondary | ICD-10-CM | POA: Diagnosis not present

## 2019-03-28 DIAGNOSIS — I10 Essential (primary) hypertension: Secondary | ICD-10-CM | POA: Diagnosis not present

## 2019-03-28 DIAGNOSIS — F411 Generalized anxiety disorder: Secondary | ICD-10-CM

## 2019-03-28 DIAGNOSIS — N529 Male erectile dysfunction, unspecified: Secondary | ICD-10-CM

## 2019-03-28 HISTORY — DX: Male erectile dysfunction, unspecified: N52.9

## 2019-03-28 MED ORDER — ALPRAZOLAM 0.5 MG PO TABS: ORAL_TABLET | ORAL | 1 refills | Status: DC

## 2019-03-28 MED ORDER — SERTRALINE HCL 100 MG PO TABS: 100.0000 mg | ORAL_TABLET | Freq: Every day | ORAL | 5 refills | Status: DC

## 2019-03-28 MED ORDER — SILDENAFIL CITRATE 100 MG PO TABS: 50.0000 mg | ORAL_TABLET | Freq: Every day | ORAL | 4 refills | Status: DC | PRN

## 2019-03-28 MED ORDER — PANTOPRAZOLE SODIUM 40 MG PO TBEC: 40.0000 mg | DELAYED_RELEASE_TABLET | Freq: Every day | ORAL | 5 refills | Status: DC

## 2019-03-28 MED ORDER — LISINOPRIL 2.5 MG PO TABS: 2.5000 mg | ORAL_TABLET | Freq: Every day | ORAL | 5 refills | Status: DC

## 2019-03-28 NOTE — Progress Notes (Addendum)
   Subjective:    Patient ID: Donald [name], male    DOB: [birth year], 34 y.o.   MRN: [id number]  HPI Pt due for 3 month follow up. Pt is taking Zoloft 100 mg daily. Pt states no side effects. Pt would like refills on Flonase nasal spray.  Patient states his moods overall doing well.  Denies being depressed.  States his overall energy level doing okay.  Healthy stay physically active.  We did discuss Covid and protection but patient currently does not want to be on insulin vaccine for Covid.  I encouraged him to rethink this and if he has further questions to ask [place]  Patient does have allergy issues request refills of Flonase.  States no major troubles but is starting currently Virtual Visit via Telephone Note  I connected with Jaynie Crumble on 03/28/19 at  9:30 AM EDT by telephone and verified that I am speaking with the correct person using two identifiers.  Location: Patient: home Provider: office   I discussed the limitations, risks, security and privacy concerns of performing an evaluation and management service by telephone and the availability of in person appointments. I also discussed with the patient that there may be a patient responsible charge related to this service. The patient expressed understanding and agreed to proceed.   History of Present Illness:    Observations/Objective:   Assessment and Plan:   Follow Up Instructions:    I discussed the assessment and treatment plan with the patient. The patient was provided an opportunity to ask questions and all were answered. The patient agreed with the plan and demonstrated an understanding of the instructions.   The patient was advised to call back or seek an in-person evaluation if the symptoms worsen or if the condition fails to improve as anticipated.  I provided 22 minutes of non-face-to-face time during this encounter.  Patient does state he takes his blood pressure medicine regular basis states is been  under good control Patient does state his reflux has been under good control takes his medicine regular basis Patient is trying to bring his weight down by watching diet and staying active He does have erectile dysfunction requests refills Review of Systems    Denies cough wheeze difficulty breathing high fevers chills does relate some runny nose Objective:   Physical Exam  Virtual unable to do exam      Assessment & Plan:  1. Essential hypertension Blood pressure reportedly good control continue current medicine refills given  2. Gastroesophageal reflux disease without esophagitis Reflux under good control with medicine continue medication watch diet closely  3. GAD (generalized anxiety disorder) Continue Zoloft does help him overall uses Xanax very rarely  4. Morbid obesity (HCC) Watch diet watch portion try to bring weight down  5. Erectile dysfunction, unspecified erectile dysfunction type Viagra refills given per request  Follow-up later this year for wellness exam Covid vaccine recommended patient defers As stated above 22 minutes spent with patient discussing multiple different issues

## 2019-04-05 ENCOUNTER — Other Ambulatory Visit: Payer: Self-pay

## 2019-04-05 ENCOUNTER — Ambulatory Visit (INDEPENDENT_AMBULATORY_CARE_PROVIDER_SITE_OTHER): Payer: Commercial Managed Care - PPO | Admitting: Family Medicine

## 2019-04-05 ENCOUNTER — Encounter: Payer: Self-pay | Admitting: Family Medicine

## 2019-04-05 DIAGNOSIS — J019 Acute sinusitis, unspecified: Secondary | ICD-10-CM

## 2019-04-05 DIAGNOSIS — J4521 Mild intermittent asthma with (acute) exacerbation: Secondary | ICD-10-CM | POA: Diagnosis not present

## 2019-04-05 MED ORDER — ALBUTEROL SULFATE HFA 108 (90 BASE) MCG/ACT IN AERS: 2.0000 | INHALATION_SPRAY | RESPIRATORY_TRACT | 1 refills | Status: DC | PRN

## 2019-04-05 MED ORDER — AMOXICILLIN-POT CLAVULANATE 875-125 MG PO TABS: 1.0000 | ORAL_TABLET | Freq: Two times a day (BID) | ORAL | 0 refills | Status: DC

## 2019-04-05 MED ORDER — PREDNISONE 20 MG PO TABS: ORAL_TABLET | ORAL | 0 refills | Status: DC

## 2019-04-05 NOTE — Progress Notes (Signed)
   Subjective:    Patient ID: Donald [name], male    DOB: [birth year], 34 y.o.   MRN: [id number]  Sinusitis This is a new problem. Episode onset: 2 weeks. Associated symptoms include congestion, coughing and sneezing. (Hot flashes) Treatments tried: allergy med.   This patient relates a lot of congestion coughing some occasional sweats denies high fevers he does relate intermittent wheezing and difficulty getting a good deep breath.  He states that that is happening intermittently and not on a regular basis patient did have Covid a few months ago does not feel he has Covid    Virtual Visit via Telephone Note  I connected with Donald [name] on 04/05/19 at  9:30 AM EDT by telephone and verified that I am speaking with the correct person using two identifiers.  Location: Patient: home Provider: office   I discussed the limitations, risks, security and privacy concerns of performing an evaluation and management service by telephone and the availability of in person appointments. I also discussed with the patient that there may be a patient responsible charge related to this service. The patient expressed understanding and agreed to proceed.   History of Present Illness:    Observations/Objective:   Assessment and Plan:   Follow Up Instructions:    I discussed the assessment and treatment plan with the patient. The patient was provided an opportunity to ask questions and all were answered. The patient agreed with the plan and demonstrated an understanding of the instructions.   The patient was advised to call back or seek an in-person evaluation if the symptoms worsen or if the condition fails to improve as anticipated.  I provided 17 minutes of non-face-to-face time during this encounter.      Review of Systems  HENT: Positive for congestion and sneezing.   Respiratory: Positive for cough.        Objective:   Physical Exam   Virtual visit unable to do exam  patient did not seem out of breath with our conversation     Assessment & Plan:  Acute rhinosinusitis Reactive airway Short course prednisone Albuterol 2 puffs every 4 hours as needed to help with any wheezing In addition to this antibiotics twice daily over the course of the next 10 days patient to give [place] update within 48 hours if he is not improving follow-up sooner if worse He does not feel this is allergies

## 2019-04-07 ENCOUNTER — Telehealth: Payer: Self-pay | Admitting: Family Medicine

## 2019-04-07 ENCOUNTER — Encounter: Payer: Self-pay | Admitting: Family Medicine

## 2019-04-07 NOTE — Telephone Encounter (Signed)
So noted thank you 

## 2019-04-07 NOTE — Telephone Encounter (Signed)
FYI ONLY Patient was told to call with an update.  He feels 100% better.  Most symptoms are gone. Still has slight cough but that is it.  Just wanted to let us know.

## 2019-05-18 ENCOUNTER — Telehealth: Payer: Self-pay | Admitting: Family Medicine

## 2019-05-18 ENCOUNTER — Other Ambulatory Visit: Payer: Self-pay | Admitting: *Deleted

## 2019-05-18 ENCOUNTER — Encounter: Payer: Self-pay | Admitting: *Deleted

## 2019-05-18 DIAGNOSIS — Z79899 Other long term (current) drug therapy: Secondary | ICD-10-CM

## 2019-05-18 DIAGNOSIS — E785 Hyperlipidemia, unspecified: Secondary | ICD-10-CM

## 2019-05-18 DIAGNOSIS — I10 Essential (primary) hypertension: Secondary | ICD-10-CM

## 2019-05-18 NOTE — Telephone Encounter (Signed)
Patient is due lipid liver metabolic 7  Diagnosis hyperlipidemia, hypertension Patient should do lab work within the next 30 days Follow-up office visit in the summer please  Nurses you may send a MyChart message, print off the orders and mail them to him with instructions to do within the next 30 days and do a follow-up office visit in the summer thank you

## 2019-05-18 NOTE — Telephone Encounter (Signed)
Sent [employer], ordered and mailed lab paperwork.

## 2019-05-31 LAB — LIPID PANEL
Chol/HDL Ratio: 5.4 ratio — ABNORMAL HIGH (ref 0.0–5.0)
Cholesterol, Total: 205 mg/dL — ABNORMAL HIGH (ref 100–199)
HDL: 38 mg/dL — ABNORMAL LOW (ref >39)
LDL Chol Calc (NIH): 141 mg/dL — ABNORMAL HIGH (ref 0–99)
Triglycerides: 142 mg/dL (ref 0–149)
VLDL Cholesterol Cal: 26 mg/dL (ref 5–40)

## 2019-05-31 LAB — BASIC METABOLIC PANEL
BUN/Creatinine Ratio: 15 (ref 9–20)
BUN: 13 mg/dL (ref 6–20)
CO2: 20 mmol/L (ref 20–29)
Calcium: 9.2 mg/dL (ref 8.7–10.2)
Chloride: 102 mmol/L (ref 96–106)
Creatinine, Ser: 0.87 mg/dL (ref 0.76–1.27)
GFR calc Af Amer: 129 mL/min/{1.73_m2} (ref >59)
GFR calc non Af Amer: 112 mL/min/{1.73_m2} (ref >59)
Glucose: 108 mg/dL — ABNORMAL HIGH (ref 65–99)
Potassium: 4.6 mmol/L (ref 3.5–5.2)
Sodium: 137 mmol/L (ref 134–144)

## 2019-05-31 LAB — HEPATIC FUNCTION PANEL
ALT: 22 IU/L (ref 0–44)
AST: 22 IU/L (ref 0–40)
Albumin: 4.2 g/dL (ref 4.0–5.0)
Alkaline Phosphatase: 87 IU/L (ref 48–121)
Bilirubin Total: 0.5 mg/dL (ref 0.0–1.2)
Bilirubin, Direct: 0.12 mg/dL (ref 0.00–0.40)
Total Protein: 7 g/dL (ref 6.0–8.5)

## 2019-06-13 ENCOUNTER — Ambulatory Visit (INDEPENDENT_AMBULATORY_CARE_PROVIDER_SITE_OTHER): Payer: Commercial Managed Care - PPO | Admitting: Family Medicine

## 2019-06-13 ENCOUNTER — Other Ambulatory Visit: Payer: Self-pay

## 2019-06-13 ENCOUNTER — Encounter: Payer: Self-pay | Admitting: Family Medicine

## 2019-06-13 ENCOUNTER — Telehealth: Payer: Self-pay | Admitting: Family Medicine

## 2019-06-13 VITALS — BP 126/82 | Temp 97.7°F | Ht 75.0 in | Wt 347.0 lb

## 2019-06-13 DIAGNOSIS — I10 Essential (primary) hypertension: Secondary | ICD-10-CM

## 2019-06-13 DIAGNOSIS — R200 Anesthesia of skin: Secondary | ICD-10-CM | POA: Diagnosis not present

## 2019-06-13 DIAGNOSIS — R0683 Snoring: Secondary | ICD-10-CM

## 2019-06-13 DIAGNOSIS — F411 Generalized anxiety disorder: Secondary | ICD-10-CM | POA: Diagnosis not present

## 2019-06-13 MED ORDER — SILDENAFIL CITRATE 100 MG PO TABS: 50.0000 mg | ORAL_TABLET | Freq: Every day | ORAL | 4 refills | Status: DC | PRN

## 2019-06-13 MED ORDER — SERTRALINE HCL 100 MG PO TABS: 100.0000 mg | ORAL_TABLET | Freq: Every day | ORAL | 5 refills | Status: DC

## 2019-06-13 MED ORDER — LISINOPRIL 2.5 MG PO TABS: 2.5000 mg | ORAL_TABLET | Freq: Every day | ORAL | 5 refills | Status: DC

## 2019-06-13 MED ORDER — PANTOPRAZOLE SODIUM 40 MG PO TBEC: 40.0000 mg | DELAYED_RELEASE_TABLET | Freq: Every day | ORAL | 5 refills | Status: DC

## 2019-06-13 NOTE — Progress Notes (Addendum)
Subjective:    Patient ID: Donald [name], male    DOB: 05-02-1985, 34 y.o.   MRN: [id number]  HPImed check up. Pt states no concerns today. phq9 and gad 7 done.  Patient states his moods are doing well He states he could do a better job watching his diet and trying to lose weight  Depression screen [facility] 2/9 03/28/2019 11/06/2016 10/09/2016  Decreased Interest 3 0 1  Down, Depressed, Hopeless 0 0 0  PHQ - 2 Score 3 0 1  Altered sleeping 1 0 0  Tired, decreased energy 0 1 2  Change in appetite 0 0 1  Feeling bad or failure about yourself  0 0 0  Trouble concentrating 0 0 0  Moving slowly or fidgety/restless 0 0 2  Suicidal thoughts 0 0 0  PHQ-9 Score 4 1 6   Difficult doing work/chores Not difficult at all Not difficult at all Not difficult at all   GAD 7 : Generalized Anxiety Score 11/09/2017 05/10/2017 11/06/2016 10/09/2016  Nervous, Anxious, on Edge 1 0 0 3  Control/stop worrying 0 0 0 2  Worry too much - different things 0 0 0 3  Trouble relaxing 1 0 0 2  Restless 1 1 0 3  Easily annoyed or irritable 1 1 0 3  Afraid - awful might happen 0 0 0 0  Total GAD 7 Score 4 2 0 16  Anxiety Difficulty Not difficult at all Not difficult at all - Very difficult         Review of Systems  Constitutional: Negative for diaphoresis and fatigue.  HENT: Negative for congestion and rhinorrhea.   Respiratory: Negative for cough and shortness of breath.   Cardiovascular: Negative for chest pain and leg swelling.  Gastrointestinal: Negative for abdominal pain and diarrhea.  Skin: Negative for color change and rash.  Neurological: Negative for dizziness and headaches.  Psychiatric/Behavioral: Negative for behavioral problems and confusion.       Objective:   Physical Exam Vitals reviewed.  Constitutional:      General: He is not in acute distress. HENT:     Head: Normocephalic and atraumatic.  Eyes:     General:        Right eye: No discharge.        Left eye: No discharge.    Neck:     Trachea: No tracheal deviation.  Cardiovascular:     Rate and Rhythm: Normal rate and regular rhythm.     Heart sounds: Normal heart sounds. No murmur.  Pulmonary:     Effort: Pulmonary effort is normal. No respiratory distress.     Breath sounds: Normal breath sounds.  Lymphadenopathy:     Cervical: No cervical adenopathy.  Skin:    General: Skin is warm and dry.  Neurological:     Mental Status: He is alert.     Coordination: Coordination normal.  Psychiatric:        Behavior: Behavior normal.           Assessment & Plan:  1. Essential hypertension Blood pressure good control continue current measures very important for the patient to try to lose weight  2. GAD (generalized anxiety disorder) Under good control with medicine continue current medication follow-up again in 6 months  3. Bilateral hand numbness He states this started up about a week ago he seen the chiropractor couple times they think it is related to a nerve in his neck I told the patient if he has ongoing  troubles not getting better over the next couple weeks call us and we would set him up for nerve conduction studies  4. Snores Patient does fall asleep when he comes home from work if he sits down but he thinks it is because he is fatigued but he does state that he snores a lot He would do a [place] questionnaire and send back to [place]  Morbid obesity patient was counseled to watch portions stay away from sodas and try to bring his weight down to lessen risk of long-term health issues  Labs were reviewed with patient cholesterol mildly elevated but not in the category of medication just yet counseled on better diet  It should be noted that the patient filled out the [place] and got his wife simply returned after his visit his [place] is positive I highly recommend sleep study for this patient we will pursue this and notify patient

## 2019-06-13 NOTE — Patient Instructions (Signed)
Results for orders placed or performed in visit on 05/18/19  Lipid panel  Result Value Ref Range   Cholesterol, Total 205 (H) 100 - 199 mg/dL   Triglycerides 142 0 - 149 mg/dL   HDL 38 (L) >39 mg/dL   VLDL Cholesterol Cal 26 5 - 40 mg/dL   LDL Chol Calc (NIH) 141 (H) 0 - 99 mg/dL   Chol/HDL Ratio 5.4 (H) 0.0 - 5.0 ratio  Hepatic function panel  Result Value Ref Range   Total Protein 7.0 6.0 - 8.5 g/dL   Albumin 4.2 4.0 - 5.0 g/dL   Bilirubin Total 0.5 0.0 - 1.2 mg/dL   Bilirubin, Direct 0.12 0.00 - 0.40 mg/dL   Alkaline Phosphatase 87 48 - 121 IU/L   AST 22 0 - 40 IU/L   ALT 22 0 - 44 IU/L  Basic metabolic panel  Result Value Ref Range   Glucose 108 (H) 65 - 99 mg/dL   BUN 13 6 - 20 mg/dL   Creatinine, Ser 0.87 0.76 - 1.27 mg/dL   GFR calc non Af Amer 112 >59 mL/min/1.73   GFR calc Af Amer 129 >59 mL/min/1.73   BUN/Creatinine Ratio 15 9 - 20   Sodium 137 134 - 144 mmol/L   Potassium 4.6 3.5 - 5.2 mmol/L   Chloride 102 96 - 106 mmol/L   CO2 20 20 - 29 mmol/L   Calcium 9.2 8.7 - 10.2 mg/dL    High Cholesterol  High cholesterol is a condition in which the blood has high levels of a white, waxy, fat-like substance (cholesterol). The human body needs small amounts of cholesterol. The liver makes all the cholesterol that the body needs. Extra (excess) cholesterol comes from the food that we eat. Cholesterol is carried from the liver by the blood through the blood vessels. If you have high cholesterol, deposits (plaques) may build up on the walls of your blood vessels (arteries). Plaques make the arteries narrower and stiffer. Cholesterol plaques increase your risk for heart attack and stroke. Work with your health care provider to keep your cholesterol levels in a healthy range. What increases the risk? This condition is more likely to develop in people who:  Eat foods that are high in animal fat (saturated fat) or cholesterol.  Are overweight.  Are not getting enough  exercise.  Have a family history of high cholesterol. What are the signs or symptoms? There are no symptoms of this condition. How is this diagnosed? This condition may be diagnosed from the results of a blood test.  If you are older than age 15, your health care provider may check your cholesterol every 4-6 years.  You may be checked more often if you already have high cholesterol or other risk factors for heart disease. The blood test for cholesterol measures:  "Bad" cholesterol (LDL cholesterol). This is the main type of cholesterol that causes heart disease. The desired level for LDL is less than 100.  "Good" cholesterol (HDL cholesterol). This type helps to protect against heart disease by cleaning the arteries and carrying the LDL away. The desired level for HDL is 60 or higher.  Triglycerides. These are fats that the body can store or burn for energy. The desired number for triglycerides is lower than 150.  Total cholesterol. This is a measure of the total amount of cholesterol in your blood, including LDL cholesterol, HDL cholesterol, and triglycerides. A healthy number is less than 200. How is this treated? This condition is treated with diet  changes, lifestyle changes, and medicines. Diet changes  This may include eating more whole grains, fruits, vegetables, nuts, and fish.  This may also include cutting back on red meat and foods that have a lot of added sugar. Lifestyle changes  Changes may include getting at least 40 minutes of aerobic exercise 3 times a week. Aerobic exercises include walking, biking, and swimming. Aerobic exercise along with a healthy diet can help you maintain a healthy weight.  Changes may also include quitting smoking. Medicines  Medicines are usually given if diet and lifestyle changes have failed to reduce your cholesterol to healthy levels.  Your health care provider may prescribe a statin medicine. Statin medicines have been shown to reduce  cholesterol, which can reduce the risk of heart disease. Follow these instructions at home: Eating and drinking If told by your health care provider:  Eat chicken (without skin), fish, veal, shellfish, ground [place] breast, and round or loin cuts of red meat.  Do not eat fried foods or fatty meats, such as hot dogs and salami.  Eat plenty of fruits, such as apples.  Eat plenty of vegetables, such as broccoli, potatoes, and carrots.  Eat beans, peas, and lentils.  Eat grains such as barley, rice, couscous, and bulgur wheat.  Eat pasta without cream sauces.  Use skim or nonfat milk, and eat low-fat or nonfat yogurt and cheeses.  Do not eat or drink whole milk, cream, ice cream, egg yolks, or hard cheeses.  Do not eat stick margarine or tub margarines that contain trans fats (also called partially hydrogenated oils).  Do not eat saturated tropical oils, such as coconut oil and palm oil.  Do not eat cakes, cookies, crackers, or other baked goods that contain trans fats.  General instructions  Exercise as directed by your health care provider. Increase your activity level with activities such as gardening, walking, and taking the stairs.  Take over-the-counter and prescription medicines only as told by your health care provider.  Do not use any products that contain nicotine or tobacco, such as cigarettes and e-cigarettes. If you need help quitting, ask your health care provider.  Keep all follow-up visits as told by your health care provider. This is important. Contact a health care provider if:  You are struggling to maintain a healthy diet or weight.  You need help to start on an exercise program.  You need help to stop smoking. Get help right away if:  You have chest pain.  You have trouble breathing. This information is not intended to replace advice given to you by your health care provider. Make sure you discuss any questions you have with your health care  provider. Document Revised: 01/01/2017 Document Reviewed: 06/29/2015 Elsevier Patient Education  2020 [employer].

## 2019-06-13 NOTE — Telephone Encounter (Signed)
I put in addendum into his office note indicating the need for a sleep study Please notify patient Derinda Late questionnaire positive Go ahead with ordering sleep study

## 2019-06-13 NOTE — Telephone Encounter (Signed)
Pt dropped off [place] Questionnaire for Dr Lorin Picket placed in the Dr Alverda Skeans

## 2019-06-14 ENCOUNTER — Other Ambulatory Visit: Payer: Self-pay | Admitting: *Deleted

## 2019-06-14 DIAGNOSIS — G4730 Sleep apnea, unspecified: Secondary | ICD-10-CM

## 2019-06-14 NOTE — Progress Notes (Signed)
Called and discussed with pt. Donald [name] had already put in order for sleep study

## 2019-06-14 NOTE — Telephone Encounter (Signed)
Patient notified, test ordered.

## 2019-06-20 ENCOUNTER — Telehealth: Payer: Self-pay | Admitting: Family Medicine

## 2019-06-20 NOTE — Telephone Encounter (Signed)
Donald [name], is this something that you can look into? Please advise. Thank you

## 2019-06-20 NOTE — Telephone Encounter (Signed)
Pt is calling to let Dr. Lorin Picket know he hasnt heard from sleep study yet to set that up. States he was told to call if he hadnt heard anything in a week.

## 2019-06-23 ENCOUNTER — Other Ambulatory Visit: Payer: Self-pay | Admitting: *Deleted

## 2019-06-23 DIAGNOSIS — G4730 Sleep apnea, unspecified: Secondary | ICD-10-CM

## 2019-06-23 NOTE — Telephone Encounter (Signed)
Order in chart says "not released" so not sure that the sleep disorders center can see it to know it needs to be scheduled

## 2019-06-23 NOTE — Telephone Encounter (Signed)
Left message to return call with [facility]

## 2019-06-23 NOTE — Telephone Encounter (Signed)
I could not release order. When I go to release it is nothing there. I put order in again and same thing it is not released and not able to release. Will need to call IT to see why we cant release

## 2019-06-23 NOTE — Telephone Encounter (Signed)
I called and let pt know that I left two messages with sleep center and will reach by out next week if I dont hear back and let him know we are working on it.

## 2019-06-26 NOTE — Telephone Encounter (Signed)
Called [facility] and was told they are working on it. They have submitted it to insurance and are waiting for approval. Pt was called and notified.

## 2019-07-03 ENCOUNTER — Telehealth: Payer: Self-pay | Admitting: Family Medicine

## 2019-07-03 NOTE — Telephone Encounter (Signed)
Called [facility] and was told pa was still pending but she would call his insurance to check status since it was put in on 6/10 and she would call and let pt know. I also called pt to let him know what I was told.

## 2019-07-03 NOTE — Telephone Encounter (Signed)
Patient is checking on sleep study . He states its been a week and was told to call back to see if we have heard anything because they havent called him yet. Please advise

## 2019-07-12 ENCOUNTER — Telehealth: Payer: Self-pay | Admitting: *Deleted

## 2019-07-12 DIAGNOSIS — R5383 Other fatigue: Secondary | ICD-10-CM

## 2019-07-12 DIAGNOSIS — R0683 Snoring: Secondary | ICD-10-CM

## 2019-07-12 NOTE — Telephone Encounter (Signed)
Donald [name] called to inform Dr. Lorin Picket that pts insurance denied sleep study. They did not give reason for denial.

## 2019-07-17 NOTE — Telephone Encounter (Signed)
So hopefully there is more details than just they denied the study  In my opinion this patient would benefit from a sleep study and it is medically indicated plus also it is a medical liability for the patient not to get this study.  Please look into it if in fact insurance has denied Donald [name] long I recommend referral to sleep medicine doctor Dr. Frances Furbish would be a good referral resource to make the referral to she is a sleep specialist Reason for referral suspected sleep apnea, snoring, fatigue  Please keep patient in the loop

## 2019-07-18 NOTE — Telephone Encounter (Signed)
Referral ordered in Epic. Telephone call- no answer- voicemail full

## 2019-07-19 ENCOUNTER — Encounter: Payer: Self-pay | Admitting: Family Medicine

## 2019-07-19 ENCOUNTER — Other Ambulatory Visit: Payer: Self-pay

## 2019-07-19 ENCOUNTER — Ambulatory Visit (INDEPENDENT_AMBULATORY_CARE_PROVIDER_SITE_OTHER): Payer: Commercial Managed Care - PPO | Admitting: Family Medicine

## 2019-07-19 DIAGNOSIS — J029 Acute pharyngitis, unspecified: Secondary | ICD-10-CM

## 2019-07-19 LAB — POCT RAPID STREP A (OFFICE): Rapid Strep A Screen: POSITIVE — AB

## 2019-07-19 MED ORDER — AZITHROMYCIN 250 MG PO TABS: ORAL_TABLET | ORAL | 0 refills | Status: DC

## 2019-07-19 NOTE — Progress Notes (Signed)
° °  Subjective:    Patient ID: Donald [name], male    DOB: Aug 05, 1985, 34 y.o.   MRN: [id number]  HPIspot on right side throat that is painful for 3 days. Having some trouble swallowing. No treatments tried.   Patient with significant pain on the right side of his throat is been going off and on over the past few days progressively worse painful to swallow.  Denies high fever chills sweats denies wheezing cough vomiting diarrhea PMH benign  Results for orders placed or performed in visit on 07/19/19  POCT rapid strep A  Result Value Ref Range   Rapid Strep A Screen Positive (A) Negative      Review of Systems Please see above    Objective:   Physical Exam Throat erythematous on the right side with exudate left side no exudate neck no adenopathy lungs are clear no respiratory difficulties heart regular patient does not appear toxic airway overall good       Assessment & Plan:  Exudative pharyngitis Strep test taken Z-Pak prescribed Follow-up if progressive troubles Work excuse next few days

## 2019-07-20 NOTE — Telephone Encounter (Signed)
Patient notified and verbalized understanding. 

## 2019-07-25 ENCOUNTER — Encounter: Payer: Self-pay | Admitting: Family Medicine

## 2019-08-31 ENCOUNTER — Ambulatory Visit (INDEPENDENT_AMBULATORY_CARE_PROVIDER_SITE_OTHER): Payer: Commercial Managed Care - PPO | Admitting: Neurology

## 2019-08-31 ENCOUNTER — Encounter: Payer: Self-pay | Admitting: Neurology

## 2019-08-31 VITALS — BP 132/84 | HR 69 | Ht 75.0 in | Wt 334.3 lb

## 2019-08-31 DIAGNOSIS — R0683 Snoring: Secondary | ICD-10-CM

## 2019-08-31 DIAGNOSIS — R400 Somnolence: Secondary | ICD-10-CM | POA: Diagnosis not present

## 2019-08-31 DIAGNOSIS — G2581 Restless legs syndrome: Secondary | ICD-10-CM

## 2019-08-31 DIAGNOSIS — Z820 Family history of epilepsy and other diseases of the nervous system: Secondary | ICD-10-CM

## 2019-08-31 DIAGNOSIS — Z6841 Body Mass Index (BMI) 40.0 and over, adult: Secondary | ICD-10-CM

## 2019-08-31 DIAGNOSIS — Z9189 Other specified personal risk factors, not elsewhere classified: Secondary | ICD-10-CM

## 2019-08-31 NOTE — Patient Instructions (Signed)

## 2019-08-31 NOTE — Progress Notes (Signed)
Subjective:    Patient ID: Donald [name] is a 34 y.o. male.  HPI     Donald Foley, MD, PhD [facility] [address], [address] [address] [place], [place] [address]  Dear Dr. Gerda Diss,   I saw your patient, Donald [name], upon your kind request, in my Sleep clinic today for initial consultation of his sleep disorder, in particular, concern for underlying obstructive sleep apnea.  The patient is unaccompanied today.  As you know, Donald [name] is a 34 year old right-handed gentleman with an underlying medical history of hypertension, depression, anxiety and obesity with a BMI of over 40, who reports snoring and excessive daytime somnolence.  His Epworth sleepiness score is 20 out of 24, fatigue severity score is 39 out of 63.  He dozes off frequently, he naps inadvertently.  He lives with his wife and 2 children, ages 66 and 65.  He can take extended naps possible.  Bedtime is generally between 1030 and 11 but he has also not before that'll already.  He works from 7:30 AM to 4:15 PM.  He works at a landfill.  Rise time is 5:30 AM typically.  He does not have night to night nocturia or recurrent morning headaches.  He drinks caffeine in the form of soda, 1 serving per day, is a non-smoker and drinks alcohol rarely.  He does have occasional restless leg symptoms but is not known to twitch or kicking his sleep as far as he knows.  His snoring is bothersome to his wife.  He has a family history of sleep apnea in his father who died at 51 from cardiac complications and he also believes his mother has a diagnosis of sleep apnea but she does not have a CPAP machine.  His Past Medical History Is Significant For: Past Medical History:  Diagnosis Date  . Anxiety   . Chronic abdominal pain   . Depression   . Dyspepsia   . Erectile dysfunction 03/28/2019   Partly related to SSRI  . HTN (hypertension) 10/19/2018  . Morbid obesity (HCC) 05/10/2017  . Pinched nerve    in his back     His Past Surgical History Is Significant For: Past Surgical History:  Procedure Laterality Date  . APPENDECTOMY     July 2019  . ESOPHAGOGASTRODUODENOSCOPY N/A 04/02/2014   RMR: Very small hiatial hernia. Gastric polyps status post biopsy. No endoscopic explanation for patient's symptoms. Bengin fundic gland polyp. Negative H.pylori  . ESOPHAGOGASTRODUODENOSCOPY (EGD) WITH PROPOFOL N/A 03/03/2018   Procedure: ESOPHAGOGASTRODUODENOSCOPY (EGD) WITH PROPOFOL;  Surgeon: Corbin Ade, MD;  Location: [facility];  Service: Endoscopy;  Laterality: N/A;  12:30pm    His Family History Is Significant For: Family History  Problem Relation Age of Onset  . Diabetes Father   . Hypertension Father   . Colon cancer Neg Hx   . Stomach cancer Neg Hx   . Colon polyps Neg Hx     His Social History Is Significant For: Social History   Socioeconomic History  . Marital status: Married    Spouse name: Not on file  . Number of children: 2  . Years of education: Not on file  . Highest education level: Not on file  Occupational History  . Occupation: [employer]     Comment: Landfill  Tobacco Use  . Smoking status: Never Smoker  . Smokeless tobacco: Never Used  Substance and Sexual Activity  . Alcohol use: Not Currently    Alcohol/week: 0.0 standard  drinks    Comment: rarely, "once in a blue moon"  . Drug use: No  . Sexual activity: Not on file  Other Topics Concern  . Not on file  Social History Narrative   As of 2020: 2 adopted children, both boys.    Social Determinants of Health   Financial Resource Strain:   . Difficulty of Paying Living Expenses: Not on file  Food Insecurity:   . Worried About Programme researcher, broadcasting/film/video in the Last Year: Not on file  . Ran Out of Food in the Last Year: Not on file  Transportation Needs:   . Lack of Transportation (Medical): Not on file  . Lack of Transportation (Non-Medical): Not on file  Physical Activity:   . Days of Exercise per Week:  Not on file  . Minutes of Exercise per Session: Not on file  Stress:   . Feeling of Stress : Not on file  Social Connections:   . Frequency of Communication with Friends and Family: Not on file  . Frequency of Social Gatherings with Friends and Family: Not on file  . Attends Religious Services: Not on file  . Active Member of Clubs or Organizations: Not on file  . Attends Banker Meetings: Not on file  . Marital Status: Not on file    His Allergies Are:  No Known Allergies:   His Current Medications Are:  Outpatient Encounter Medications as of 08/31/2019  Medication Sig  . albuterol (VENTOLIN HFA) 108 (90 Base) MCG/ACT inhaler Inhale 2 puffs into the lungs every 4 (four) hours as needed for wheezing.  Marland Kitchen ALPRAZolam (XANAX) 0.5 MG tablet 1/2 to 1 bid prn panic attacks  . augmented betamethasone dipropionate (DIPROLENE-AF) 0.05 % cream Apply 1 application topically 2 (two) times daily as needed (for psoriasis).   Marland Kitchen azithromycin (ZITHROMAX Z-PAK) 250 MG tablet Take 2 tablets (500 mg) on  Day 1,  followed by 1 tablet (250 mg) once daily on Days 2 through 5.  . fluticasone (FLONASE) 50 MCG/ACT nasal spray Place 1 spray into both nostrils daily.  Marland Kitchen lisinopril (ZESTRIL) 2.5 MG tablet Take 1 tablet (2.5 mg total) by mouth daily.  . pantoprazole (PROTONIX) 40 MG tablet Take 1 tablet (40 mg total) by mouth daily.  . sertraline (ZOLOFT) 100 MG tablet Take 1 tablet (100 mg total) by mouth daily.  . sildenafil (VIAGRA) 100 MG tablet Take 0.5-1 tablets (50-100 mg total) by mouth daily as needed for erectile dysfunction.   No facility-administered encounter medications on file as of 08/31/2019.  :  Review of Systems:  Out of a complete 14 point review of systems, all are reviewed and negative with the exception of these symptoms as listed below: Review of Systems  Neurological:       Here for sleep consult. No prior sleep study, snoring is present.   Epworth Sleepiness Scale 0=  would never doze 1= slight chance of dozing 2= moderate chance of dozing 3= high chance of dozing  Sitting and reading:3 Watching TV:3 Sitting inactive in a public place (ex. Theater or meeting):2 As a passenger in a car for an hour without a break:2 Lying down to rest in the afternoon:3 Sitting and talking to someone:2 Sitting quietly after lunch (no alcohol):3 In a car, while stopped in traffic:1 Total:20     Objective:  Neurological Exam  Physical Exam Physical Examination:   Vitals:   08/31/19 1141  BP: 132/84  Pulse: 69  SpO2: 98%  General Examination: The patient is a very pleasant 34 y.o. male in no acute distress. He appears well-developed and well-nourished and well groomed.   HEENT: Normocephalic, atraumatic, pupils are equal, round and reactive to light, extraocular tracking is good without limitation to gaze excursion or nystagmus noted. Hearing is grossly intact. Face is symmetric with normal facial animation. Speech is clear with no dysarthria noted. There is no hypophonia. There is no lip, neck/head, jaw or voice tremor. Neck is supple with full range of passive and active motion. There are no carotid bruits on auscultation. Oropharynx exam reveals: mild mouth dryness, adequate dental hygiene and moderate airway crowding, due to tonsillar size of 1+, slightly wider uvula, small airway entry, tongue protrudes centrally and palate elevates symmetrically, Mallampati class II.  Neck circumference is 21-1/4 inches.  He has a mild overbite.  Chest: Clear to auscultation without wheezing, rhonchi or crackles noted.  Heart: S1+S2+0, regular and normal without murmurs, rubs or gallops noted.   Abdomen: Soft, non-tender and non-distended with normal bowel sounds appreciated on auscultation.  Extremities: There is no pitting edema in the distal lower extremities bilaterally.   Skin: Warm and dry with mild psoriatic changes seen in the shin areas bilaterally.     Musculoskeletal: exam reveals no obvious joint deformities, tenderness or joint swelling or erythema.   Neurologically:  Mental status: The patient is awake, alert and oriented in all 4 spheres. His immediate and remote memory, attention, language skills and fund of knowledge are appropriate. There is no evidence of aphasia, agnosia, apraxia or anomia. Speech is clear with normal prosody and enunciation. Thought process is linear. Mood is normal and affect is normal.  Cranial nerves II - XII are as described above under HEENT exam.  Motor exam: Normal bulk, strength and tone is noted. There is no tremor, Romberg is negative. Fine motor skills and coordination: grossly intact.  Cerebellar testing: No dysmetria or intention tremor. There is no truncal or gait ataxia.  Sensory exam: intact to light touch in the upper and lower extremities.  Gait, station and balance: He stands easily. No veering to one side is noted. No leaning to one side is noted. Posture is age-appropriate and stance is narrow based. Gait shows normal stride length and normal pace. No problems turning are noted. Tandem walk is unremarkable.               Assessment and Plan:  In summary, Donald [name] is a very pleasant 34 y.o.-year old male with an underlying medical history of hypertension, depression, anxiety and obesity with a BMI of over 40, whose history and physical exam are concerning for obstructive sleep apnea (OSA). I had a long chat with the patient about my findings and the diagnosis of OSA, its prognosis and treatment options. We talked about medical treatments, surgical interventions and non-pharmacological approaches. I explained in particular the risks and ramifications of untreated moderate to severe OSA, especially with respect to developing cardiovascular disease down the Road, including congestive heart failure, difficult to treat hypertension, cardiac arrhythmias, or stroke. Even type 2 diabetes has, in part,  been linked to untreated OSA. Symptoms of untreated OSA include daytime sleepiness, memory problems, mood irritability and mood disorder such as depression and anxiety, lack of energy, as well as recurrent headaches, especially morning headaches. We talked about trying to maintain a healthy lifestyle in general, as well as the importance of weight control. We also talked about the importance of good sleep hygiene. I recommended  the following at this time: sleep study.  I explained the sleep test procedure to the patient and also outlined possible surgical and non-surgical treatment options of OSA, including the use of a custom-made dental device (which would require a referral to a specialist dentist or oral surgeon), upper airway surgical options, such as traditional UPPP or a novel less invasive surgical option in the form of Inspire hypoglossal nerve stimulation (which would involve a referral to an ENT surgeon). I also explained the CPAP treatment option to the patient, who indicated that he would be willing to try CPAP if the need arises. I explained the importance of being compliant with PAP treatment, not only for insurance purposes but primarily to improve His symptoms, and for the patient's long term health benefit, including to reduce His cardiovascular risks. I answered all his questions today and the patient was in agreement. I plan to see him back after the sleep study is completed and encouraged him to call with any interim questions, concerns, problems or updates.   Thank you very much for allowing me to participate in the care of this nice patient. If I can be of any further assistance to you please do not hesitate to call me at [phone].  Sincerely,   Donald FoleySaima Min Tunnell, MD, PhD

## 2019-09-14 ENCOUNTER — Telehealth: Payer: Self-pay | Admitting: Neurology

## 2019-09-14 NOTE — Telephone Encounter (Signed)
Called patient and explained that we are still waiting for approval. Explained to patient that as soon as I get the auth, I will call him ASAP. Pt understood and had no further questions.

## 2019-09-14 NOTE — Telephone Encounter (Signed)
Pt would like to know if there has been a response from his insurance company on if they will or will not approve him to have the sleep study.

## 2019-09-27 ENCOUNTER — Telehealth: Payer: Self-pay

## 2019-09-27 NOTE — Telephone Encounter (Signed)
Called patient this morning to let him know that we are having trouble getting out from his insurance. Pt will try to contact insurance to see if he can get info about sleep studies. Will be waiting for his return call back.

## 2019-10-16 ENCOUNTER — Other Ambulatory Visit: Payer: Self-pay

## 2019-10-16 ENCOUNTER — Ambulatory Visit (INDEPENDENT_AMBULATORY_CARE_PROVIDER_SITE_OTHER): Payer: Commercial Managed Care - PPO | Admitting: Neurology

## 2019-10-16 DIAGNOSIS — G2581 Restless legs syndrome: Secondary | ICD-10-CM

## 2019-10-16 DIAGNOSIS — Z820 Family history of epilepsy and other diseases of the nervous system: Secondary | ICD-10-CM

## 2019-10-16 DIAGNOSIS — R400 Somnolence: Secondary | ICD-10-CM

## 2019-10-16 DIAGNOSIS — R0683 Snoring: Secondary | ICD-10-CM

## 2019-10-16 DIAGNOSIS — Z9189 Other specified personal risk factors, not elsewhere classified: Secondary | ICD-10-CM

## 2019-10-16 DIAGNOSIS — G4733 Obstructive sleep apnea (adult) (pediatric): Secondary | ICD-10-CM

## 2019-10-19 NOTE — Addendum Note (Signed)
Addended by: Huston Foley on: 10/19/2019 06:04 PM   Modules accepted: Orders

## 2019-10-19 NOTE — Progress Notes (Signed)
Patient referred by Dr. Gerda Diss, seen by me on 08/31/19, HST on 10/15/19.    Please call and notify the patient that the recent home sleep test showed obstructive sleep apnea. OSA is overall mild, but worth treating to see if he feels better after treatment, particularly with regards to his daytime sleepiness. To that end I recommend treatment for this in the form of autoPAP, which means, that we don't have to bring him in for a sleep study with CPAP, but will let him try an autoPAP machine at home, through a DME company (of his choice, or as per insurance requirement). The DME representative will educate him on how to use the machine, how to put the mask on, etc. I have placed an order in the chart. Please send referral, talk to patient, send report to referring MD. We will need a FU in sleep clinic for 10 weeks post-PAP set up, please arrange that with me or one of our NPs. Thanks,   Huston Foley, MD, PhD [facility])

## 2019-10-19 NOTE — Procedures (Signed)
Sleep Study Report   Patient Information     First Name: Donald Last Name: Rithik [name]: [id number]  Birth Date: 20-Nov-1985 Age: 34 Gender: Male  Referring Provider: Babs Sciara, MD BMI: 42.1 (W=335 lb, H=6' 3'')  Neck Circ.:  21 '' Epworth:  20/24   Sleep Study Information    Study Date: 10/16/19 S/H/A Version: [id number] / [id number] / 76  History:    34 year old man with a history of hypertension, depression, anxiety and obesity with a BMI of over 40, who reports snoring and excessive daytime somnolence. Summary & Diagnosis:    OSA, mild   Recommendations:     This home sleep test demonstrates overall mild obstructive sleep apnea with a total AHI of 11.6/hour and O2 nadir of 89%. Given the patient's medical history and sleep related complaints, treatment with positive airway pressure is recommended. This can be achieved in the form of autoPAP trial/titration at home. A full night CPAP titration study can help with optimization of proper treatment settings and mask fitting if needed down the road. Alternative treatments include weight loss along with avoidance of the supine sleep position, or an oral appliance in appropriate candidates.   Please note that untreated obstructive sleep apnea carries additional perioperative morbidity. Patients with significant obstructive sleep apnea should receive perioperative PAP therapy and the surgeons and particularly the anesthesiologist should be informed of the diagnosis and the severity of the sleep disordered breathing. The patient should be cautioned not to drive, work at heights, or operate dangerous or heavy equipment when tired or sleepy. Review and reiteration of good sleep hygiene measures should be pursued with any patient. Other causes of the patient's symptoms, including circadian rhythm disturbances, an underlying mood disorder, medication effect and/or an underlying medical problem cannot be ruled out based on this test. Clinical correlation  is recommended.   The patient and his referring provider will be notified of the test results. The patient will be seen in follow up in sleep clinic at [facility].  I certify that I have reviewed the raw data recording prior to the issuance of this report in accordance with the standards of the American Academy of Sleep Medicine (AASM).  Huston Foley, MD, PhD [facility]) Diplomat, ABPN (Neurology and Sleep)         Sleep Summary  Oxygen Saturation Statistics   Start Study Time: End Study Time: Total Recording Time:  9:59:53 PM 5:34:27 AM   7 h, 34 min  Total Sleep Time % REM of Sleep Time:  6 h, 54 min  25.5    Mean: 95 Minimum: 89 Maximum: 98  Mean of Desaturations Nadirs (%):   92  Oxygen Desaturation. %: 4-9 10-20 >20 Total  Events Number Total  37 100.0  0 0.0  0 0.0  37 100.0  Oxygen Saturation: <90 <=88 <85 <80 <70  Duration (minutes): Sleep % 0.2 0.0 0.0 0.0 0.0 0.0 0.0 0.0 0.0 0.0     Respiratory Indices      Total Events REM NREM All Night  pRDI: pAHI 3%: ODI 4%: pAHIc 3%: % CSR: pAHI 4%:  90  77  37  2 0.0 37 21.1 18.2 8.2 1.2 10.9 9.3 4.7 0.0 13.5 11.6 5.6 0.3 5.6       Pulse Rate Statistics during Sleep (BPM)      Mean: 66 Minimum: N/A Maximum: 96    Indices are calculated using technically valid sleep time of  6 h, 38 min.  pAHI=11.6                               Mild              Moderate                    Severe                                                 5              15                    30    Body Position Statistics  Position Supine Prone Right Left Non-Supine  Sleep (min) 213.5 0.0 48.0 152.5 200.5  Sleep % 51.6 0.0 11.6 36.8 48.4  pRDI 15.8 N/A 3.1 13.0 10.9  pAHI 3% 15.6 N/A 3.1 8.1 7.1  ODI 4% 7.9 N/A 0.0 3.6 2.9            Left   Right  Supine    Snoring Statistics Snoring Level (dB) >40 >50 >60 >70 >80 >Threshold (45)  Sleep (min) 128.9 6.6  1.8 0.0 0.0 16.0  Sleep % 31.1 1.6 0.4 0.0 0.0 3.9    Mean: 41 dB Sleep Stages Chart                         Wake  Sleep      Wake  8.92  %    Sleep  91.08  %   Total:  %  100.00                                                         REM  Light  Deep      REM  25.48  %    Light  %  52.90    Deep  %  21.62   Total:  100.00  %                                 Sleep/Wake States  Sleep Stages  Sleep Latency (min):  REM Latency (min):  Number of Wakes:   20   48   5

## 2019-11-20 ENCOUNTER — Other Ambulatory Visit: Payer: Self-pay | Admitting: *Deleted

## 2019-11-20 ENCOUNTER — Telehealth: Payer: Self-pay | Admitting: Family Medicine

## 2019-11-20 MED ORDER — SERTRALINE HCL 100 MG PO TABS: 100.0000 mg | ORAL_TABLET | Freq: Every day | ORAL | 1 refills | Status: DC

## 2019-11-20 MED ORDER — LISINOPRIL 2.5 MG PO TABS: 2.5000 mg | ORAL_TABLET | Freq: Every day | ORAL | 1 refills | Status: DC

## 2019-11-20 NOTE — Telephone Encounter (Signed)
Refills sent and please call pt call pt to schedule.

## 2019-11-20 NOTE — Telephone Encounter (Signed)
[facility] requesting refill on Sertraline 100 mg tablet. Take one tablet by mouth daily. Also requesting refill on Lisinopril 2.5 mg. Take one tablet po daily. Pt last seen 07/19/19. Please advise. Thank you

## 2019-11-20 NOTE — Telephone Encounter (Signed)
May have 2  months on rxs, need to sched follow up ov

## 2019-11-21 NOTE — Telephone Encounter (Signed)
Appointment schedule for 11/24 for medication follow up

## 2019-12-06 ENCOUNTER — Other Ambulatory Visit: Payer: Self-pay

## 2019-12-06 ENCOUNTER — Ambulatory Visit (INDEPENDENT_AMBULATORY_CARE_PROVIDER_SITE_OTHER): Payer: Commercial Managed Care - PPO | Admitting: Family Medicine

## 2019-12-06 ENCOUNTER — Encounter: Payer: Self-pay | Admitting: Family Medicine

## 2019-12-06 VITALS — BP 120/82 | HR 90 | Temp 97.2°F | Wt 336.4 lb

## 2019-12-06 DIAGNOSIS — I10 Essential (primary) hypertension: Secondary | ICD-10-CM | POA: Diagnosis not present

## 2019-12-06 DIAGNOSIS — Z23 Encounter for immunization: Secondary | ICD-10-CM | POA: Diagnosis not present

## 2019-12-06 DIAGNOSIS — F411 Generalized anxiety disorder: Secondary | ICD-10-CM | POA: Diagnosis not present

## 2019-12-06 MED ORDER — LISINOPRIL 2.5 MG PO TABS
2.5000 mg | ORAL_TABLET | Freq: Every day | ORAL | 1 refills | Status: DC
Start: 2019-12-06 — End: 2020-08-19

## 2019-12-06 MED ORDER — PANTOPRAZOLE SODIUM 40 MG PO TBEC
40.0000 mg | DELAYED_RELEASE_TABLET | Freq: Every day | ORAL | 1 refills | Status: DC
Start: 2019-12-06 — End: 2020-09-12

## 2019-12-06 MED ORDER — SERTRALINE HCL 100 MG PO TABS
100.0000 mg | ORAL_TABLET | Freq: Every day | ORAL | 1 refills | Status: DC
Start: 2019-12-06 — End: 2020-08-19

## 2019-12-06 NOTE — Progress Notes (Signed)
   Subjective:    Patient ID: Donald [name], male    DOB: 1985-04-22, 34 y.o.   MRN: [id number]  Hypertension This is a chronic problem. Associated symptoms include anxiety. Pertinent negatives include no chest pain, headaches or shortness of breath. Treatments tried: Lisinopril. There are no compliance problems.   Anxiety Patient reports no chest pain, confusion, dizziness or shortness of breath.    Patient relates compliance with the medication Watching diet Drinking mainly water Active with his job     Review of Systems  Constitutional: Negative for diaphoresis and fatigue.  HENT: Negative for congestion and rhinorrhea.   Respiratory: Negative for cough and shortness of breath.   Cardiovascular: Negative for chest pain and leg swelling.  Gastrointestinal: Negative for abdominal pain and diarrhea.  Skin: Negative for color change and rash.  Neurological: Negative for dizziness and headaches.  Psychiatric/Behavioral: Negative for behavioral problems and confusion.       Objective:   Physical Exam Vitals reviewed.  Constitutional:      General: He is not in acute distress. HENT:     Head: Normocephalic and atraumatic.  Eyes:     General:        Right eye: No discharge.        Left eye: No discharge.  Neck:     Trachea: No tracheal deviation.  Cardiovascular:     Rate and Rhythm: Normal rate and regular rhythm.     Heart sounds: Normal heart sounds. No murmur heard.   Pulmonary:     Effort: Pulmonary effort is normal. No respiratory distress.     Breath sounds: Normal breath sounds.  Lymphadenopathy:     Cervical: No cervical adenopathy.  Skin:    General: Skin is warm and dry.  Neurological:     Mental Status: He is alert.     Coordination: Coordination normal.  Psychiatric:        Behavior: Behavior normal.     Denies being depressed      Assessment & Plan:  1. Need for vaccination Flu shot today - Flu Vaccine QUAD 6+ mos PF IM (Fluarix Quad  PF)  2. Morbid obesity (HCC) Portion control activity lose weight  3. GAD (generalized anxiety disorder) Continue Celexa rarely uses Xanax follow-up in 6 months  4. Primary hypertension Blood pressure under good control watch diet minimize salt continue medication  Covid vaccine recommended patient defers

## 2020-01-26 ENCOUNTER — Other Ambulatory Visit: Payer: Self-pay

## 2020-01-26 ENCOUNTER — Ambulatory Visit (INDEPENDENT_AMBULATORY_CARE_PROVIDER_SITE_OTHER): Payer: Commercial Managed Care - PPO | Admitting: Family Medicine

## 2020-01-26 DIAGNOSIS — L089 Local infection of the skin and subcutaneous tissue, unspecified: Secondary | ICD-10-CM | POA: Diagnosis not present

## 2020-01-26 DIAGNOSIS — Z20822 Contact with and (suspected) exposure to covid-19: Secondary | ICD-10-CM | POA: Diagnosis not present

## 2020-01-26 MED ORDER — DOXYCYCLINE HYCLATE 100 MG PO TABS: 100.0000 mg | ORAL_TABLET | Freq: Two times a day (BID) | ORAL | 0 refills | Status: DC

## 2020-01-26 NOTE — Progress Notes (Signed)
   Subjective:    Patient ID: Donald [name], male    DOB: 03-10-1985, 35 y.o.   MRN: [id number]  Cough This is a new problem. The current episode started yesterday. Associated symptoms include a fever, headaches, myalgias and nasal congestion.   Patient states a few days worth of head congestion drainage cough and body aches.  Feels worse today's date out of work  Also has a tender area on his nose states not quite as bad as it was   Review of Systems  Constitutional: Positive for fever.  Respiratory: Positive for cough.   Musculoskeletal: Positive for myalgias.  Neurological: Positive for headaches.       Objective:   Physical Exam Vitals and nursing note reviewed.  Constitutional:      Appearance: He is well-developed.  HENT:     Head: Normocephalic and atraumatic.     Mouth/Throat:     Mouth: Oropharynx is clear and moist.     Pharynx: No oropharyngeal exudate.  Eyes:     General:        Right eye: No discharge.        Left eye: No discharge.  Cardiovascular:     Rate and Rhythm: Normal rate and regular rhythm.     Heart sounds: Normal heart sounds. No murmur heard.   Pulmonary:     Effort: Pulmonary effort is normal. No respiratory distress.     Breath sounds: Normal breath sounds. No wheezing or rales.  Musculoskeletal:     Cervical back: Normal range of motion.  Lymphadenopathy:     Cervical: No cervical adenopathy.  Skin:    General: Skin is warm and dry.  Neurological:     Motor: No abnormal muscle tone.     Warning signs regarding COVID was discussed Patient had COVID last year and did not require hospitalization Temperature is normal O2 saturation is 97 percent no respiratory distress currently patient does not appear toxic     Assessment & Plan:   Covid infection This is a viral process.  Mild cases are treated with supportive measures at home such as Tylenol rest fluids.  In some situations monoclonal antibodies may be appropriate depending  on the patient's risk criteria.  The patient was educated regarding progressive illness including respiratory, persistent vomiting, change in mental status.  If any of these occur ER evaluation is recommended. Patient was educated about the following as well Covid-19 respiratory warning: Covid-19 is a virus that causes hypoxia (low oxygen level in blood) in some people. If you develop any changes in your usual breathing pattern: difficulty catching your breath, more short winded with activity or with resting, or anything that concerns you about your breathing, do not hesitate to go to the emergency department immediately for evaluation. Please do not delay to get treatment.   Agrees with plan of care discussed today. Understands warning signs to seek further care: Chest pain, shortness of breath, mental confusion, profuse vomiting, any significant change in health. Understands to follow-up if symptoms do not improve, or worsen.     Also has a tender area on his nose doxycycline for 7 days follow-up if ongoing troubles  COVID test pending

## 2020-01-26 NOTE — Patient Instructions (Signed)

## 2020-01-28 LAB — SARS-COV-2, NAA 2 DAY TAT

## 2020-01-28 LAB — SPECIMEN STATUS REPORT

## 2020-01-28 LAB — NOVEL CORONAVIRUS, NAA: SARS-CoV-2, NAA: DETECTED — AB

## 2020-01-29 ENCOUNTER — Telehealth: Payer: Self-pay | Admitting: Unknown Physician Specialty

## 2020-01-29 NOTE — Telephone Encounter (Signed)
Called to Discuss with patient about Covid symptoms and the use of the monoclonal antibody infusion for those with mild to moderate Covid symptoms and at a high risk of hospitalization.     Pt appears to qualify for this infusion due to co-morbid conditions and/or a member of an at-risk group in accordance with the FDA Emergency Use Authorization.    Pt refuses to discuss at this time

## 2020-03-06 ENCOUNTER — Telehealth: Payer: Self-pay | Admitting: Neurology

## 2020-03-06 DIAGNOSIS — G4733 Obstructive sleep apnea (adult) (pediatric): Secondary | ICD-10-CM

## 2020-03-06 NOTE — Telephone Encounter (Signed)
Pt. is asking for a set pressure for CPAP machine. Please advise.

## 2020-03-06 NOTE — Telephone Encounter (Signed)
Pt called and we had an extended conversation about the possibility of him using his dad's older cpap machine.  I have reached out to the DME company asking for further information and will update accordingly.

## 2020-03-07 NOTE — Telephone Encounter (Signed)
I have received feed back from DME on this pt. See message below. DME stated in separate message machine would be card DL only.   I will discuss message with Dr. Frances Furbish and we will go from there.   No worries, happy to help! I remember speaking w/ this pt back when we were getting him scheduled originally. We wouldn't be collecting anything at setup, but the cost would be going to his deductible and it ends up being ~$900.   He can use his father's CPAP, yes. However, it is a VERY old S8. We can provide supplies IF we have them, which is a real big if, honestly. We would not be able to do much in the way of maintenance because there will be no warranty to speak of. His insurance should pay for supplies since we have documentation qualifying him for PAP therapy.   If pt chooses to use this machine, it does need to have a set pressure.   I hope this helps!

## 2020-03-07 NOTE — Addendum Note (Signed)
Addended by: Ann Maki on: 03/07/2020 12:44 PM   Modules accepted: Orders

## 2020-03-07 NOTE — Telephone Encounter (Signed)
I spoke with Dr. Frances Furbish verbally on this message, and she is agreeable for the patient to start using his father's old machine.  She provided a verbal of a pressure of 8 cm water EPR per patient's preference and supplies as required by machine.  I called patient back and advised we would send an order over to [facility] for him to begin using his father's old machine.  Patient advised he has a visit with [facility] on Monday, February 28 to go over steps on how to start using his machine at home.  Patient has been scheduled for follow-up 05/20/2020 for office visit to check status on how he is doing.  Patient understands he will need to bring his machine each time to the office so we can do a download while he is here.  Patient was advised the machine that he is currently using does not have the capability to be downloaded remotely.

## 2020-05-13 ENCOUNTER — Encounter: Payer: Self-pay | Admitting: Family Medicine

## 2020-05-13 ENCOUNTER — Other Ambulatory Visit: Payer: Self-pay

## 2020-05-13 ENCOUNTER — Ambulatory Visit (INDEPENDENT_AMBULATORY_CARE_PROVIDER_SITE_OTHER): Payer: Commercial Managed Care - PPO | Admitting: Family Medicine

## 2020-05-13 VITALS — HR 88 | Temp 100.4°F | Ht 75.0 in | Wt 341.5 lb

## 2020-05-13 DIAGNOSIS — J019 Acute sinusitis, unspecified: Secondary | ICD-10-CM

## 2020-05-13 MED ORDER — AMOXICILLIN 500 MG PO TABS: 500.0000 mg | ORAL_TABLET | Freq: Three times a day (TID) | ORAL | 0 refills | Status: DC

## 2020-05-13 NOTE — Progress Notes (Signed)
   Subjective:    Patient ID: Donald [name], male    DOB: 07-Jul-1985, 35 y.o.   MRN: [id number]  HPI  Patient presents today with respiratory illness Number of days present-3 days  Symptoms include- cough, sneezing, sore throat, congestion, body aches  Presence of worrisome signs (severe shortness of breath, lethargy, etc.) - none  Recent/current visit to urgent care or ER- none  Recent direct exposure to Covid- none  Any current Covid testing- none Head congestion sinus drainage sinus pressure discolored drainage denies wheezing difficulty breathing   Review of Systems See above    Objective:   Physical Exam  Lungs clear heart regular mild sinus tenderness throat normal      Assessment & Plan:  Work excuse given for Monday Tuesday COVID test Supportive measures Amoxicillin for sinus infection Allergy medicine as needed Stay home for the next couple days call back if any problems

## 2020-05-15 LAB — NOVEL CORONAVIRUS, NAA: SARS-CoV-2, NAA: NOT DETECTED

## 2020-05-20 ENCOUNTER — Encounter: Payer: Self-pay | Admitting: Neurology

## 2020-05-20 ENCOUNTER — Ambulatory Visit (INDEPENDENT_AMBULATORY_CARE_PROVIDER_SITE_OTHER): Payer: Commercial Managed Care - PPO | Admitting: Neurology

## 2020-05-20 VITALS — BP 137/82 | HR 78 | Ht 75.0 in | Wt 343.0 lb

## 2020-05-20 DIAGNOSIS — Z9989 Dependence on other enabling machines and devices: Secondary | ICD-10-CM | POA: Diagnosis not present

## 2020-05-20 DIAGNOSIS — G4733 Obstructive sleep apnea (adult) (pediatric): Secondary | ICD-10-CM

## 2020-05-20 NOTE — Progress Notes (Signed)
Subjective:    Patient ID: Donald [name] is a 35 y.o. male.  HPI     Interim history:   Donald [name] is a 35 year old right-handed gentleman with an underlying medical history of hypertension, depression, anxiety and obesity with a BMI of over 40, who presents for follow-up consultation of his obstructive sleep apnea after interim testing and starting CPAP therapy.  The patient is unaccompanied today.  I first met him at the request of his primary care physician on 08/31/2019, at which time he reported snoring and daytime somnolence.  He was advised to proceed with a sleep study.  He had a home sleep test on 10/16/2019 which indicated overall mild obstructive sleep apnea with an AHI of 11.6/h, O2 nadir 89%.  He was unable to start his on AutoPap machine but was able to get his father's old CPAP machine.  Today, 05/20/2020: He reports feeling less tired and snoring is improved per wife and he is less restless while asleep per wife's feedback.  Nevertheless, he still has some residual tiredness.  Unfortunately, a download is not possible from his CPAP machine as he has an older model, [employer] II.  He has not yet received any supplies from the DME company, he did not call them recently about getting supplies.  He is working on weight loss.  He admits that he does not always drink enough water, he likes to drink soda and tea, typically diet.  The patient's allergies, current medications, family history, past medical history, past social history, past surgical history and problem list were reviewed and updated as appropriate.   Previously:   08/31/19: (He) reports snoring and excessive daytime somnolence.  His Epworth sleepiness score is 20 out of 24, fatigue severity score is 39 out of 63.  He dozes off frequently, he naps inadvertently.  He lives with his wife and 2 children, ages 58 and 55.  He can take extended naps possible.  Bedtime is generally between 1030 and 11 but he has also not before  that'll already.  He works from 7:30 AM to 4:15 PM.  He works at a landfill.  Rise time is 5:30 AM typically.  He does not have night to night nocturia or recurrent morning headaches.  He drinks caffeine in the form of soda, 1 serving per day, is a non-smoker and drinks alcohol rarely.  He does have occasional restless leg symptoms but is not known to twitch or kicking his sleep as far as he knows.  His snoring is bothersome to his wife.  He has a family history of sleep apnea in his father who died at 67 from cardiac complications and he also believes his mother has a diagnosis of sleep apnea but she does not have a CPAP machine.   His Past Medical History Is Significant For: Past Medical History:  Diagnosis Date  . Anxiety   . Chronic abdominal pain   . Depression   . Dyspepsia   . Erectile dysfunction 03/28/2019   Partly related to SSRI  . HTN (hypertension) 10/19/2018  . Morbid obesity ([place]) 05/10/2017  . Pinched nerve    in his back    His Past Surgical History Is Significant For: Past Surgical History:  Procedure Laterality Date  . APPENDECTOMY     July 2019  . ESOPHAGOGASTRODUODENOSCOPY N/A 04/02/2014   RMR: Very small hiatial hernia. Gastric polyps status post biopsy. No endoscopic explanation for patient's symptoms. Bengin fundic gland polyp. Negative H.pylori  . ESOPHAGOGASTRODUODENOSCOPY (EGD)  WITH PROPOFOL N/A 03/03/2018   Procedure: ESOPHAGOGASTRODUODENOSCOPY (EGD) WITH PROPOFOL;  Surgeon: Daneil Dolin, MD;  Location: [facility] SUITE;  Service: Endoscopy;  Laterality: N/A;  12:30pm    His Family History Is Significant For: Family History  Problem Relation Age of Onset  . Diabetes Father   . Hypertension Father   . Colon cancer Neg Hx   . Stomach cancer Neg Hx   . Colon polyps Neg Hx     His Social History Is Significant For: Social History   Socioeconomic History  . Marital status: Married    Spouse name: Not on file  . Number of children: 2  . Years of  education: Not on file  . Highest education level: Not on file  Occupational History  . Occupation: [employer]     Comment: Landfill  Tobacco Use  . Smoking status: Never Smoker  . Smokeless tobacco: Never Used  Substance and Sexual Activity  . Alcohol use: Not Currently    Alcohol/week: 0.0 standard drinks    Comment: rarely, "once in a blue moon"  . Drug use: No  . Sexual activity: Not on file  Other Topics Concern  . Not on file  Social History Narrative   As of 2020: 2 adopted children, both boys.    Social Determinants of Health   Financial Resource Strain: Not on file  Food Insecurity: Not on file  Transportation Needs: Not on file  Physical Activity: Not on file  Stress: Not on file  Social Connections: Not on file    His Allergies Are:  No Known Allergies:   His Current Medications Are:  Outpatient Encounter Medications as of 05/20/2020  Medication Sig  . albuterol (VENTOLIN HFA) 108 (90 Base) MCG/ACT inhaler Inhale 2 puffs into the lungs every 4 (four) hours as needed for wheezing.  Marland Kitchen ALPRAZolam (XANAX) 0.5 MG tablet 1/2 to 1 bid prn panic attacks  . amoxicillin (AMOXIL) 500 MG tablet Take 1 tablet (500 mg total) by mouth 3 (three) times daily.  Marland Kitchen augmented betamethasone dipropionate (DIPROLENE-AF) 0.05 % cream Apply 1 application topically 2 (two) times daily as needed (for psoriasis).   . fluticasone (FLONASE) 50 MCG/ACT nasal spray Place 1 spray into both nostrils daily.  Marland Kitchen lisinopril (ZESTRIL) 2.5 MG tablet Take 1 tablet (2.5 mg total) by mouth daily.  . pantoprazole (PROTONIX) 40 MG tablet Take 1 tablet (40 mg total) by mouth daily.  . sertraline (ZOLOFT) 100 MG tablet Take 1 tablet (100 mg total) by mouth daily.  . sildenafil (VIAGRA) 100 MG tablet Take 0.5-1 tablets (50-100 mg total) by mouth daily as needed for erectile dysfunction.   No facility-administered encounter medications on file as of 05/20/2020.  :  Review of Systems:  Out of a complete  14 point review of systems, all are reviewed and negative with the exception of these symptoms as listed below:  Review of Systems  Neurological:       Patient presents today to follow-up on his CPAP.  Patient has his father's machine.  Patient's CPAP is unable to be downloaded by this office.  Patient has not received recent CPAP supplies.    Objective:  Neurological Exam  Physical Exam Physical Examination:   Vitals:   05/20/20 1359  BP: 137/82  Pulse: 78    General Examination: The patient is a very pleasant 35 y.o. male in no acute distress. He appears well-developed and well-nourished and well groomed.   HEENT: Normocephalic, atraumatic, pupils are equal,  round and reactive to light, extraocular tracking is well-preserved, hearing grossly intact, face is symmetric with normal facial animation.  Airway examination reveals mild mouth dryness, stable findings, tongue protrudes centrally and palate elevates symmetrically, no carotid bruits.    Chest: Clear to auscultation without wheezing, rhonchi or crackles noted.  Heart: S1+S2+0, regular and normal without murmurs, rubs or gallops noted.   Abdomen: Soft, non-tender and non-distended.  Extremities: There is no pitting edema in the distal lower extremities bilaterally.   Skin: No obvious new changes.      Musculoskeletal: exam reveals no obvious joint deformities.  Neurologically:  Mental status: The patient is awake, alert and oriented in all 4 spheres. His immediate and remote memory, attention, language skills and fund of knowledge are appropriate. There is no evidence of aphasia, agnosia, apraxia or anomia. Speech is clear with normal prosody and enunciation. Thought process is linear. Mood is normal and affect is normal.  Cranial nerves II - XII are as described above under HEENT exam.  Motor exam: Normal bulk, strength and tone is noted. There is no tremor, fine motor skills and coordination: grossly intact.   Cerebellar testing: No dysmetria or intention tremor. There is no truncal or gait ataxia.  Sensory exam: intact to light touch in the upper and lower extremities.  Gait, station and balance: He stands easily. No veering to one side is noted. No leaning to one side is noted. Posture is age-appropriate and stance is narrow based. Gait shows normal stride length and normal pace. No problems turning are noted.  Assessment and Plan:  In summary, Donald [name] is a very pleasant 35 year old male with an underlying medical history of hypertension, depression, anxiety and obesity with a BMI of over 40, who presents for follow-up consultation of his obstructive sleep apnea after interim home sleep testing, his home sleep test from 10/16/2019 indicated overall mild obstructive sleep apnea, AHI was 11.6/h, O2 nadir 89%.  He has been using his father's old CPAP machine.  This is a model that cannot be downloaded in this office and does not have a modem.  He reports compliance with treatment and also improvement in his daytime tiredness and sleep consolidation as well as sleep quality.  Nevertheless, since he does have some residual tiredness, he is advised to make an appointment with his primary care physician to discuss treatable causes for tiredness including anemia, vitamin deficiencies such as vitamin D and vitamin B12 deficiencies and thyroid dysfunction.  He has not had any recent blood work.  He is advised to follow-up routinely in sleep clinic to see one of our nurse practitioners in 1 year.  He is encouraged to continue to work on weight loss and stay better hydrated with water.  I have written a new prescription for supplies.  We will send this to a local DME company.  He has been in touch with [facility].  He is encouraged to continue to be compliant with treatment.  I answered all his questions today and he was in agreement with the plan.

## 2020-05-20 NOTE — Patient Instructions (Addendum)
I am glad to hear that your sleepiness is better and that your sleep quality and snoring have improved.  Please continue using your CPAP regularly. While your insurance requires that you use CPAP at least 4 hours each night on 70% of the nights, I recommend, that you not skip any nights and use it throughout the night if you can. Getting used to CPAP and staying with the treatment long term does take time and patience and discipline. Untreated obstructive sleep apnea when it is moderate to severe can have an adverse impact on cardiovascular health and raise her risk for heart disease, arrhythmias, hypertension, congestive heart failure, stroke and diabetes. Untreated obstructive sleep apnea causes sleep disruption, nonrestorative sleep, and sleep deprivation. This can have an impact on your day to day functioning and cause daytime sleepiness and impairment of cognitive function, memory loss, mood disturbance, and problems focussing. Using CPAP regularly can improve these symptoms.  Follow-up routinely to see one of our nurse practitioners in 1 year.  Unfortunately, you have an older model CPAP machine and we cannot download any data from it.  For residual daytime tiredness, please make a follow-up appointment with your primary care physician to look for other treatable causes of tiredness including thyroid dysfunction, anemia, vitamin D deficiency, vitamin B12 deficiency.  Please continue to work on weight loss.

## 2020-07-25 ENCOUNTER — Ambulatory Visit
Admission: EM | Admit: 2020-07-25 | Discharge: 2020-07-25 | Disposition: A | Discharge status: Home / Self Care | Payer: Commercial Managed Care - PPO | Attending: Emergency Medicine | Admitting: Emergency Medicine

## 2020-07-25 ENCOUNTER — Encounter: Payer: Self-pay | Admitting: Emergency Medicine

## 2020-07-25 ENCOUNTER — Other Ambulatory Visit: Payer: Self-pay

## 2020-07-25 DIAGNOSIS — H60502 Unspecified acute noninfective otitis externa, left ear: Secondary | ICD-10-CM

## 2020-07-25 DIAGNOSIS — H938X3 Other specified disorders of ear, bilateral: Secondary | ICD-10-CM | POA: Diagnosis not present

## 2020-07-25 MED ORDER — CIPROFLOXACIN-DEXAMETHASONE 0.3-0.1 % OT SUSP: 4.0000 [drp] | Freq: Two times a day (BID) | OTIC | 0 refills | Status: AC

## 2020-07-25 MED ORDER — PREDNISONE 20 MG PO TABS: 20.0000 mg | ORAL_TABLET | Freq: Two times a day (BID) | ORAL | 0 refills | Status: AC

## 2020-07-25 NOTE — ED Provider Notes (Signed)
[facility]   [id number] 07/25/20 Arrival Time: 1340  CC:EAR pressure  SUBJECTIVE: History from: patient.  Donald [name] is a 35 y.o. male who presents with of bilateral ear pressure x 1 day.  Denies a precipitating event, such as swimming or wearing ear plugs.  Patient states the pain is constant and clogged.  Denies alleviating or aggravating factors.  Denies similar symptoms in the past.    Denies fever, chills, fatigue, sinus pain, rhinorrhea, ear discharge, sore throat, SOB, wheezing, chest pain, nausea, changes in bowel or bladder habits.    ROS: As per HPI.  All other pertinent ROS negative.     Past Medical History:  Diagnosis Date   Anxiety    Chronic abdominal pain    Depression    Dyspepsia    Erectile dysfunction 03/28/2019   Partly related to SSRI   HTN (hypertension) 10/19/2018   Morbid obesity (HCC) 05/10/2017   Pinched nerve    in his back   Past Surgical History:  Procedure Laterality Date   APPENDECTOMY     July 2019   ESOPHAGOGASTRODUODENOSCOPY N/A 04/02/2014   RMR: Very small hiatial hernia. Gastric polyps status post biopsy. No endoscopic explanation for patient's symptoms. Bengin fundic gland polyp. Negative H.pylori   ESOPHAGOGASTRODUODENOSCOPY (EGD) WITH PROPOFOL N/A 03/03/2018   Procedure: ESOPHAGOGASTRODUODENOSCOPY (EGD) WITH PROPOFOL;  Surgeon: Donald Ade, MD;  Location: [facility];  Service: Endoscopy;  Laterality: N/A;  12:30pm   No Known Allergies No current facility-administered medications on file prior to encounter.   Current Outpatient Medications on File Prior to Encounter  Medication Sig Dispense Refill   albuterol (VENTOLIN HFA) 108 (90 Base) MCG/ACT inhaler Inhale 2 puffs into the lungs every 4 (four) hours as needed for wheezing. 18 g 1   ALPRAZolam (XANAX) 0.5 MG tablet 1/2 to 1 bid prn panic attacks 24 tablet 1   augmented betamethasone dipropionate (DIPROLENE-AF) 0.05 % cream Apply 1 application topically 2 (two)  times daily as needed (for psoriasis).      fluticasone (FLONASE) 50 MCG/ACT nasal spray Place 1 spray into both nostrils daily. 16 g 0   lisinopril (ZESTRIL) 2.5 MG tablet Take 1 tablet (2.5 mg total) by mouth daily. 90 tablet 1   pantoprazole (PROTONIX) 40 MG tablet Take 1 tablet (40 mg total) by mouth daily. 90 tablet 1   sertraline (ZOLOFT) 100 MG tablet Take 1 tablet (100 mg total) by mouth daily. 90 tablet 1   sildenafil (VIAGRA) 100 MG tablet Take 0.5-1 tablets (50-100 mg total) by mouth daily as needed for erectile dysfunction. 5 tablet 4   Social History   Socioeconomic History   Marital status: Married    Spouse name: Not on file   Number of children: 2   Years of education: Not on file   Highest education level: Not on file  Occupational History   Occupation: [employer] [place]     Comment: Landfill  Tobacco Use   Smoking status: Never   Smokeless tobacco: Never  Substance and Sexual Activity   Alcohol use: Not Currently    Alcohol/week: 0.0 standard drinks    Comment: rarely, "once in a blue moon"   Drug use: No   Sexual activity: Not on file  Other Topics Concern   Not on file  Social History Narrative   As of 2020: 2 adopted children, both boys.    Social Determinants of Health   Financial Resource Strain: Not on file  Food Insecurity: Not on  file  Transportation Needs: Not on file  Physical Activity: Not on file  Stress: Not on file  Social Connections: Not on file  Intimate Partner Violence: Not on file   Family History  Problem Relation Age of Onset   Diabetes Father    Hypertension Father    Colon cancer Neg Hx    Stomach cancer Neg Hx    Colon polyps Neg Hx     OBJECTIVE:  Vitals:   07/25/20 1350  BP: 138/79  Pulse: 84  Resp: 16  Temp: 98.5 F (36.9 C)  TempSrc: Oral  SpO2: 94%    General appearance: alert; well-appearing, nontoxic; speaking in full sentences and tolerating own secretions HEENT: NCAT; Ears: EACs clear, TMs pearly gray,  LT EAC erythematous; Eyes: PERRL.  EOM grossly intact.Nose: nares patent without rhinorrhea, Throat: oropharynx clear, tonsils non erythematous or enlarged, uvula midline  Neck: supple without LAD Lungs: unlabored respirations, symmetrical air entry; cough: absent; no respiratory distress; CTAB Heart: regular rate and rhythm.  Skin: warm and dry Psychological: alert and cooperative; normal mood and affect    ASSESSMENT & PLAN:  1. Clogged ear, bilateral   2. Ear pressure, bilateral   3. Acute otitis externa of left ear, unspecified type     Meds ordered this encounter  Medications   ciprofloxacin-dexamethasone (CIPRODEX) OTIC suspension    Sig: Place 4 drops into the left ear 2 (two) times daily for 7 days.    Dispense:  7.5 mL    Refill:  0    Order Specific Question:   Supervising Provider    Answer:   Donald [name] [[id number]]   predniSONE (DELTASONE) 20 MG tablet    Sig: Take 1 tablet (20 mg total) by mouth 2 (two) times daily with a meal for 5 days.    Dispense:  10 tablet    Refill:  0    Order Specific Question:   Supervising Provider    Answer:   Donald [name] [[id number]]    Rest and drink plenty of fluids Eardrops prescribed Prednisone prescribed Take medications as directed and to completion Continue to use OTC ibuprofen and/ or tylenol as needed for pain control Follow up with PCP if symptoms persists Return here or go to the ER if you have any new or worsening symptoms   Reviewed expectations re: course of current medical issues. Questions answered. Outlined signs and symptoms indicating need for more acute intervention. Patient verbalized understanding. After Visit Summary given.          Donald Harding, PA-C 07/25/20 1434

## 2020-07-25 NOTE — Discharge Instructions (Addendum)
Rest and drink plenty of fluids Eardrops prescribed Prednisone prescribed Take medications as directed and to completion Continue to use OTC ibuprofen and/ or tylenol as needed for pain control Follow up with PCP if symptoms persists Return here or go to the ER if you have any new or worsening symptoms

## 2020-07-25 NOTE — ED Triage Notes (Signed)
States left ear is clogged and can hardly hear out of ear.  States both ears were ringing last night.

## 2020-08-04 ENCOUNTER — Telehealth: Payer: Self-pay | Admitting: Family Medicine

## 2020-08-04 DIAGNOSIS — Z79899 Other long term (current) drug therapy: Secondary | ICD-10-CM

## 2020-08-04 DIAGNOSIS — I10 Essential (primary) hypertension: Secondary | ICD-10-CM

## 2020-08-04 DIAGNOSIS — E785 Hyperlipidemia, unspecified: Secondary | ICD-10-CM

## 2020-08-04 NOTE — Telephone Encounter (Signed)
Patient is due for a metabolic 7, lipid, liver and a follow-up office visit Please do lab work within the next 60 days please follow-up office visit by early fall

## 2020-08-05 NOTE — Telephone Encounter (Signed)
Patient notified and stated he just had a lot of lab work thru his [employer] a month or 2 ago and can bring results to office at appt.

## 2020-08-18 ENCOUNTER — Other Ambulatory Visit: Payer: Self-pay | Admitting: Family Medicine

## 2020-08-19 NOTE — Telephone Encounter (Signed)
May have 1 month of each needs to schedule follow-up office visit

## 2020-08-23 IMAGING — DX DG CHEST 2V
2 series · 2 of 2 positions shown · non-contrast
Comparison: None.

CLINICAL DATA: Abdominal pain

EXAM:
CHEST - 2 VIEW

[chest pa]
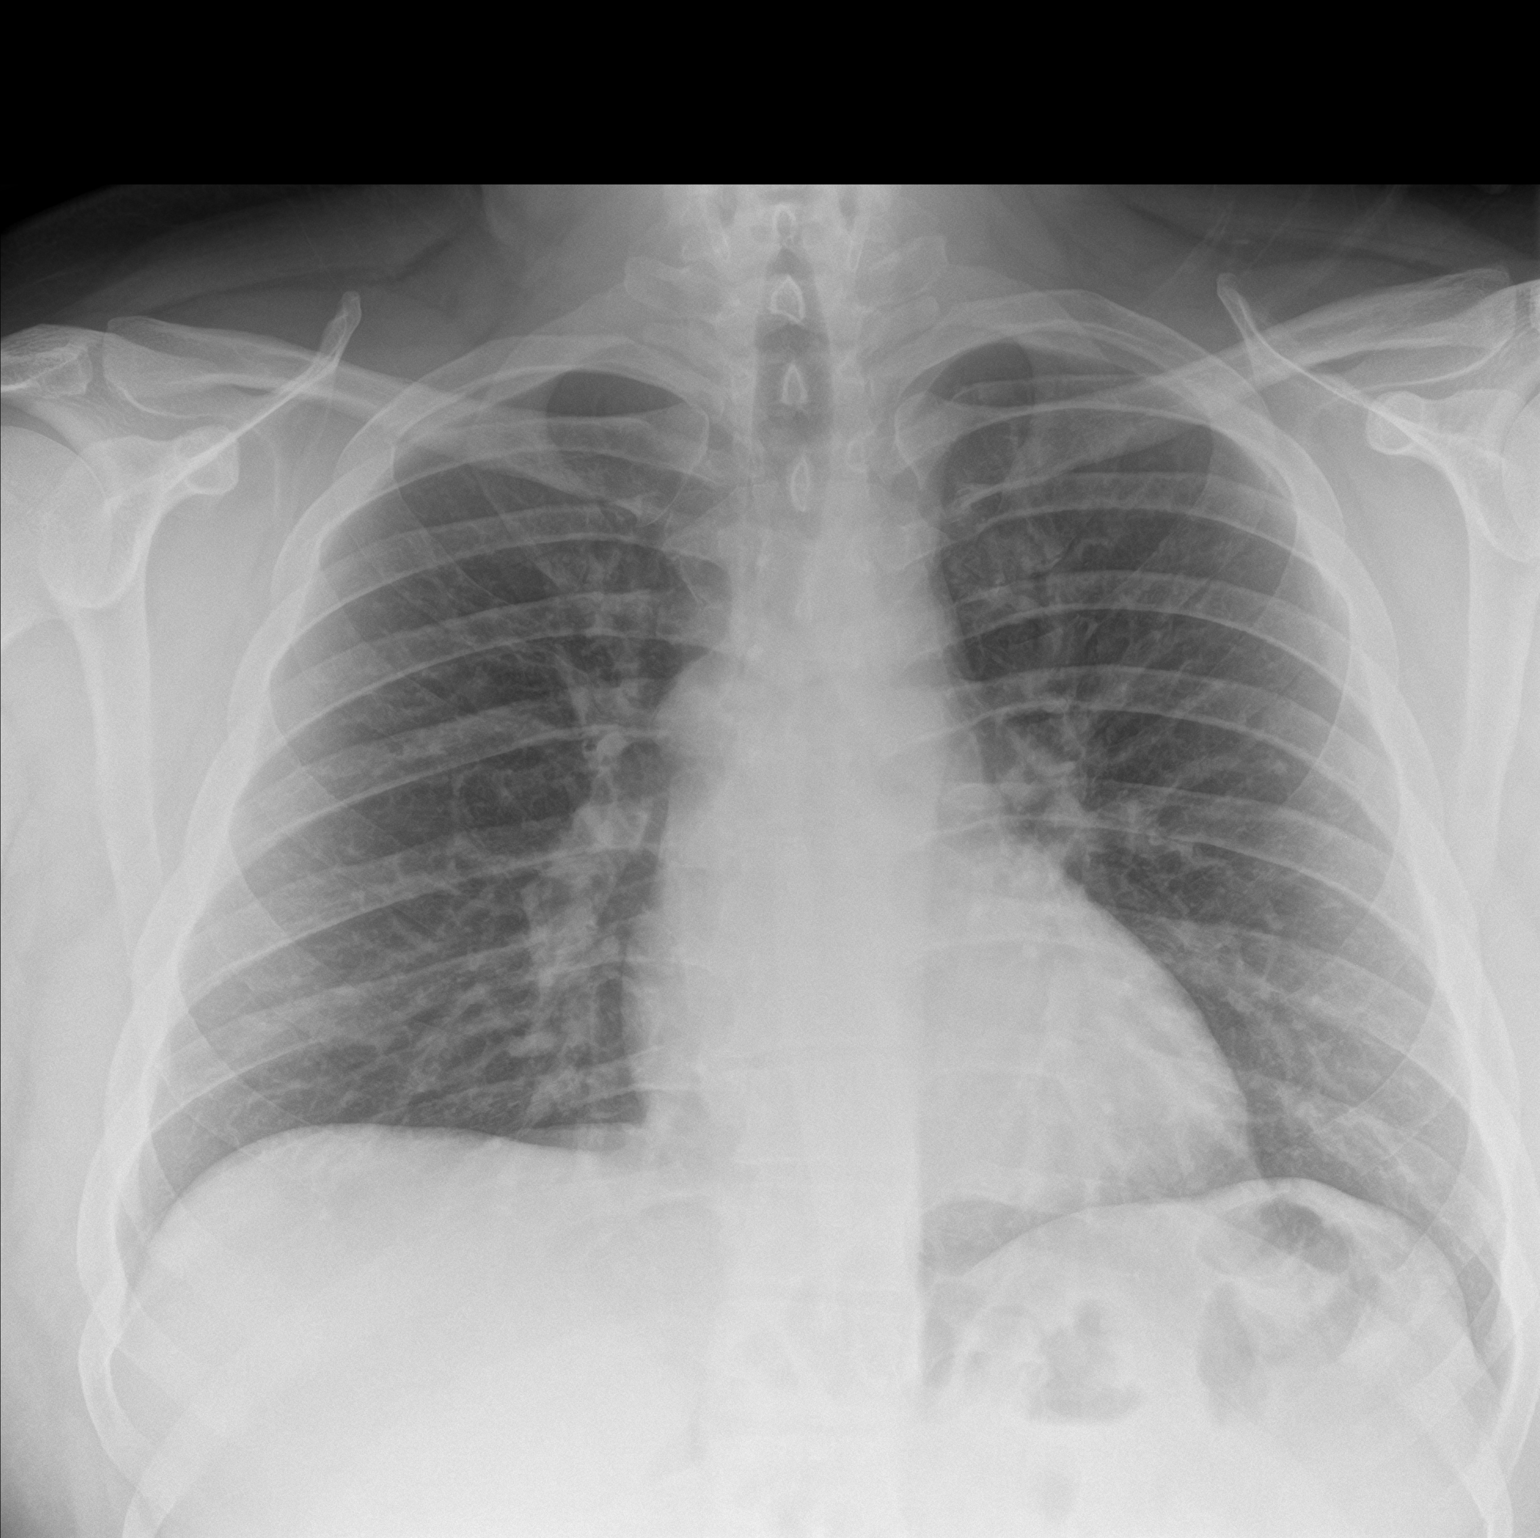

[chest lat]
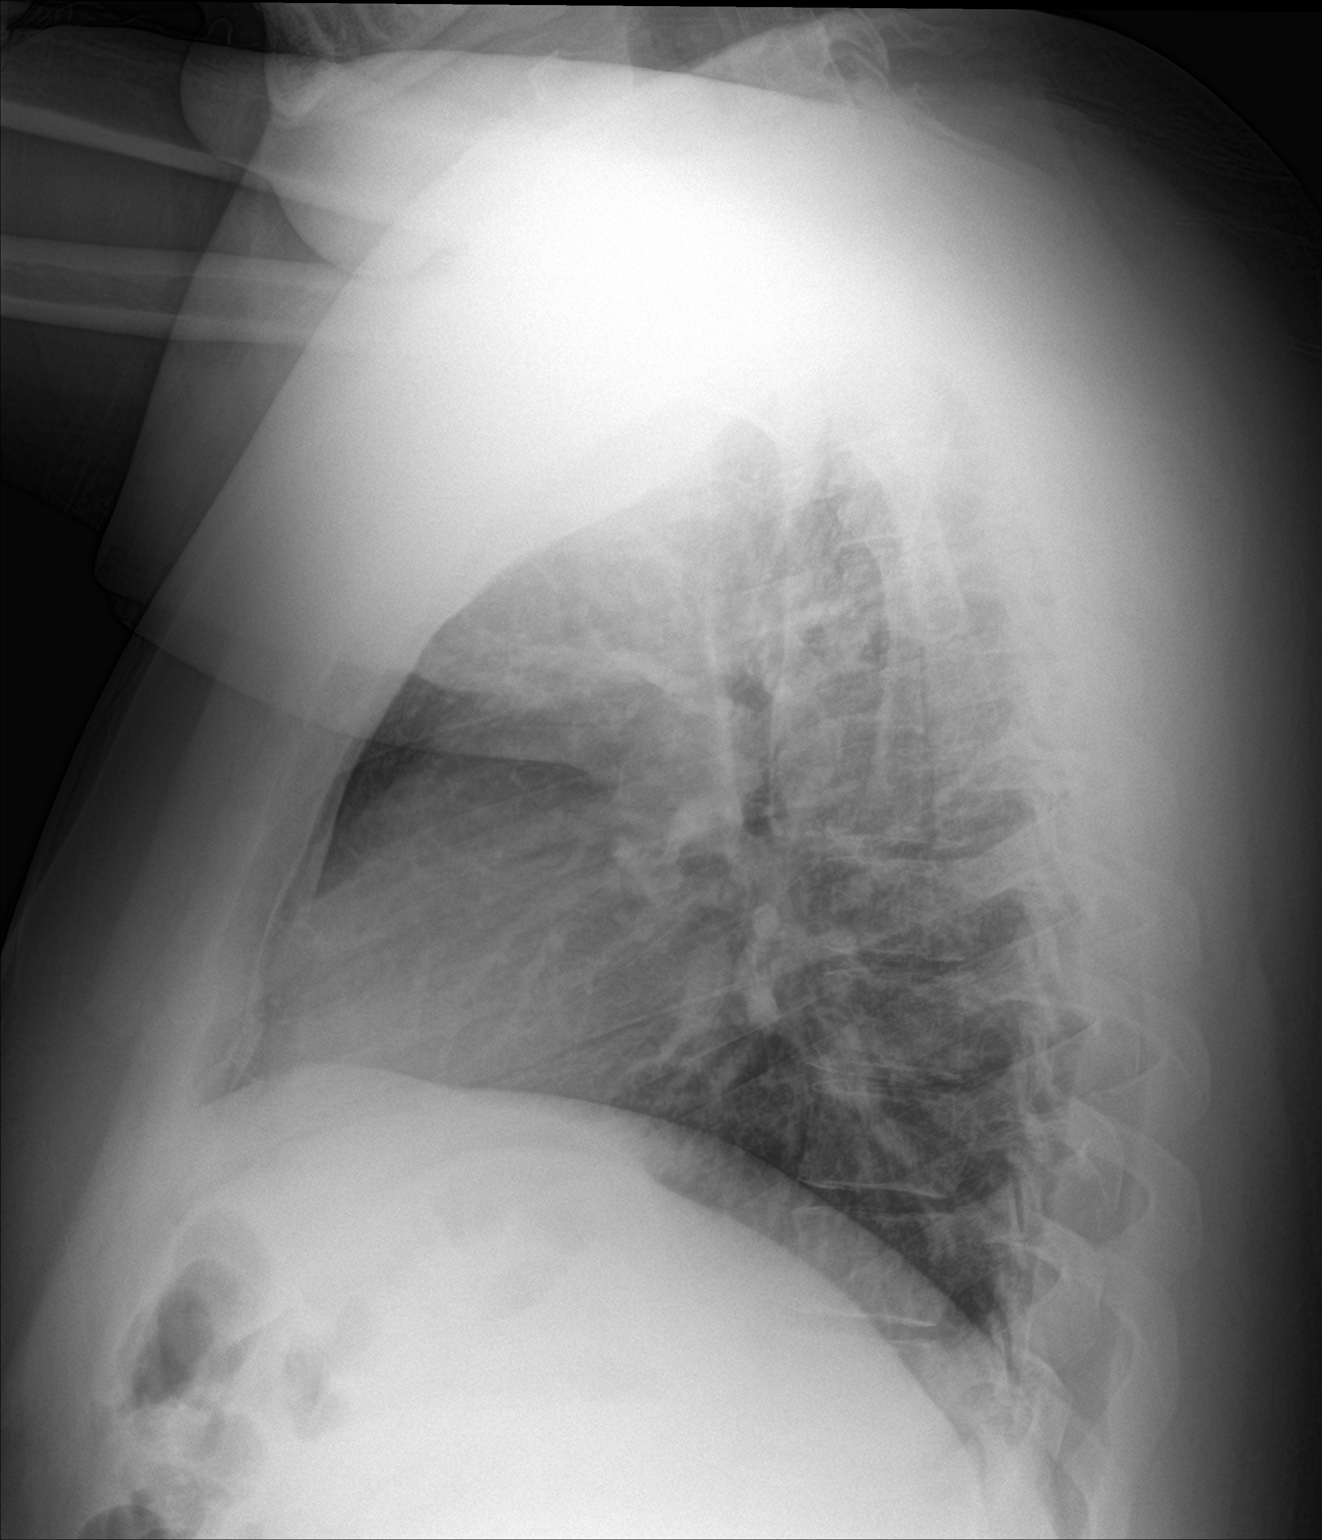

[2 of 2 positions shown; findings below may reference images not displayed]

FINDINGS: The lungs are clear without focal pneumonia, edema, pneumothorax or
pleural effusion. The cardiopericardial silhouette is within normal
limits for size. The visualized bony structures of the thorax are
intact.
IMPRESSION: No active cardiopulmonary disease.

## 2020-08-23 IMAGING — CT CT ABD-PELV W/ CM
2 of 4 series · 16 of 46 positions shown, 18 images · IV contrast (Isovue)
Comparison: 08/02/2017 CT abdomen/pelvis.

CLINICAL DATA: Acute epigastric abdominal pain, worse postprandial.

EXAM:
CT ABDOMEN AND PELVIS WITH CONTRAST
TECHNIQUE: Multidetector CT imaging of the abdomen and pelvis was performed
using the standard protocol following bolus administration of
intravenous contrast.
CONTRAST:  100mL OMNIPAQUE IOHEXOL 300 MG/ML  SOLN

[Series 2: axial st · axial · 0.88mm/px · z∈[+958,+1428]mm · 13 of 104 slices shown, 15 images]
[im 5/104  soft-tissue]
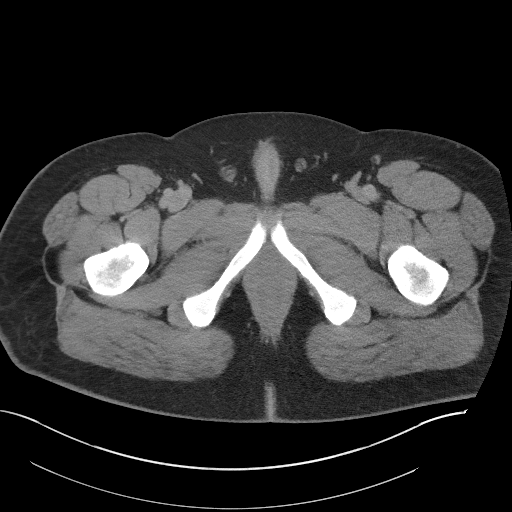
[im 5/104  bone]
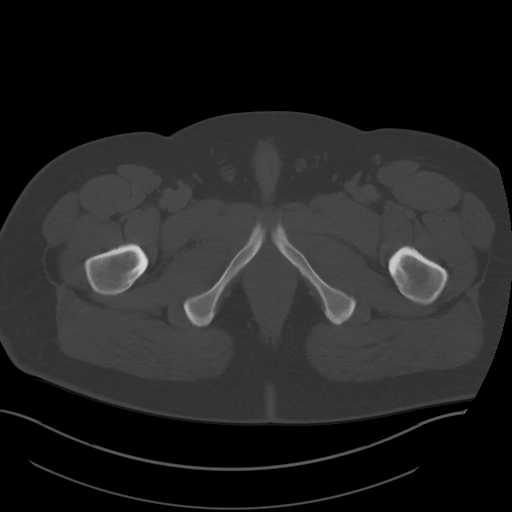
[im 13/104  soft-tissue]
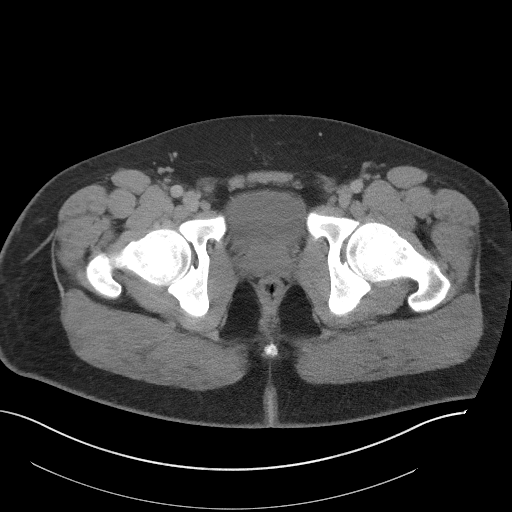
[im 22/104  soft-tissue]
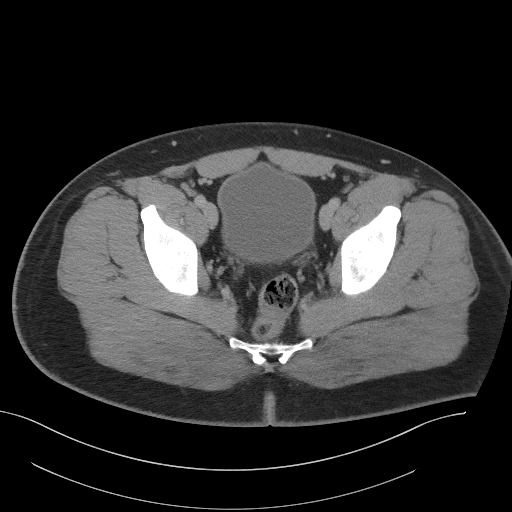
[im 31/104  soft-tissue]
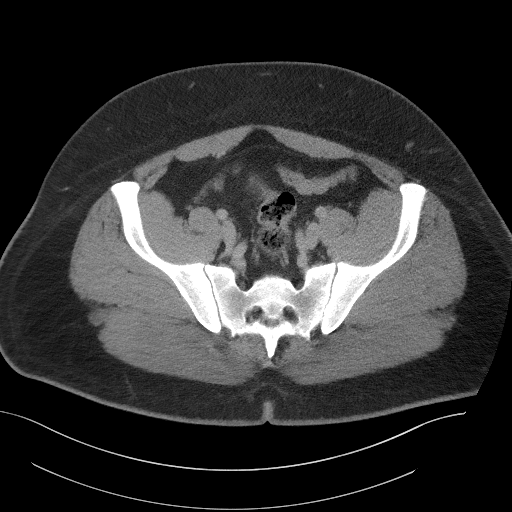
[im 35/104  soft-tissue]
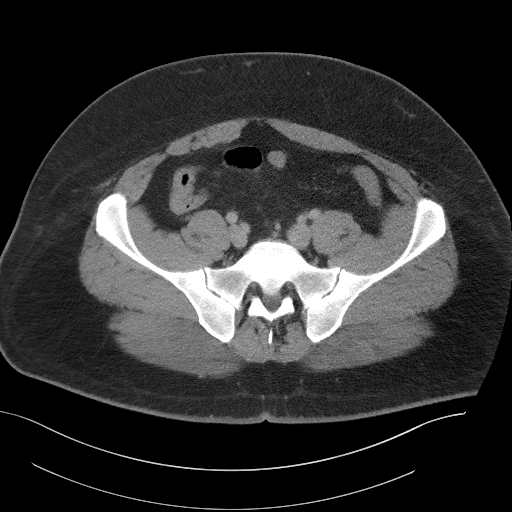
[im 43/104  soft-tissue]
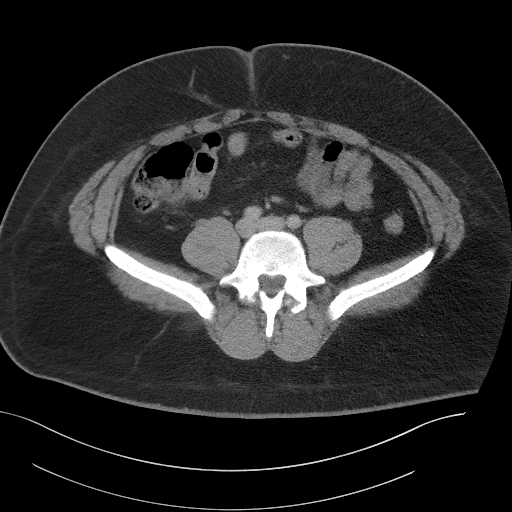
[im 52/104  soft-tissue]
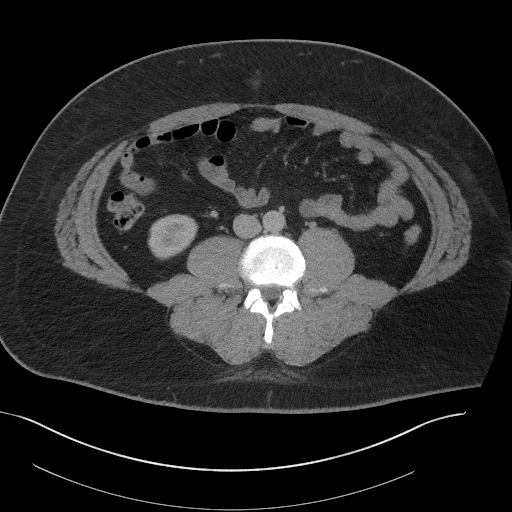
[im 61/104  soft-tissue]
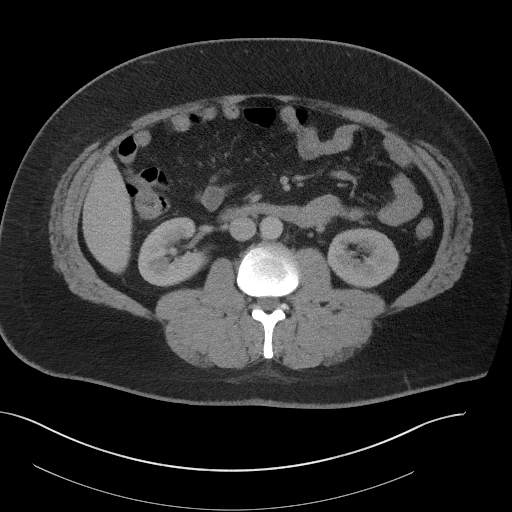
[im 69/104  soft-tissue]
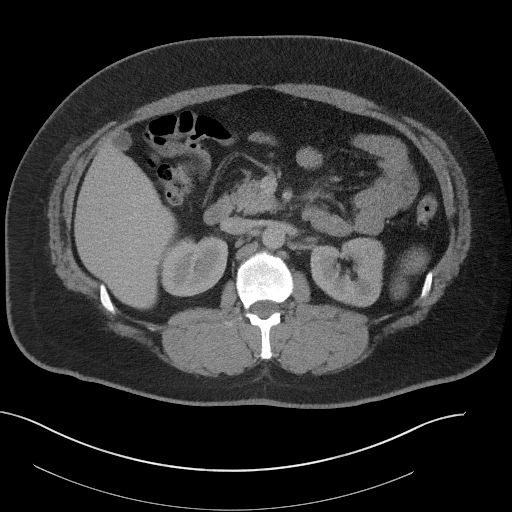
[im 69/104  bone]
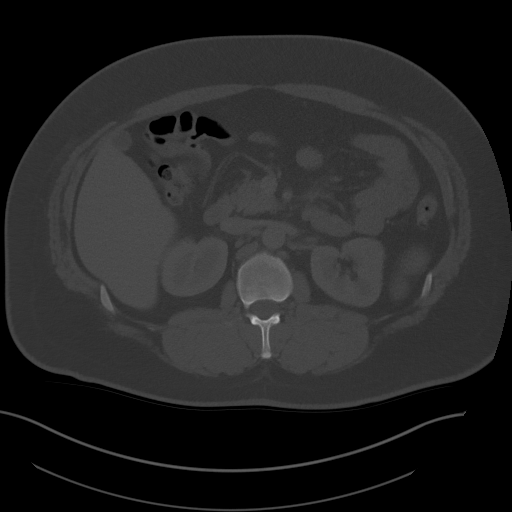
[im 73/104  soft-tissue]
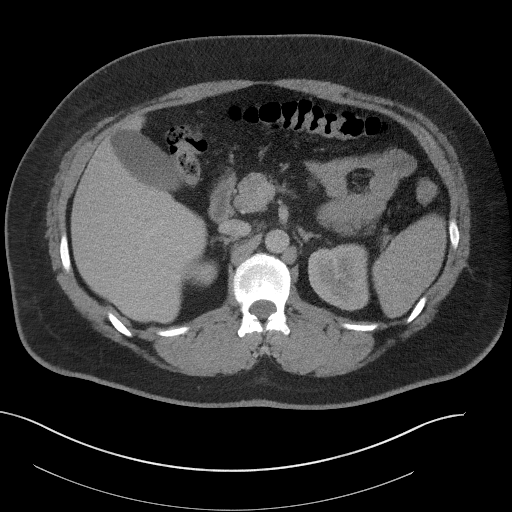
[im 82/104  soft-tissue]
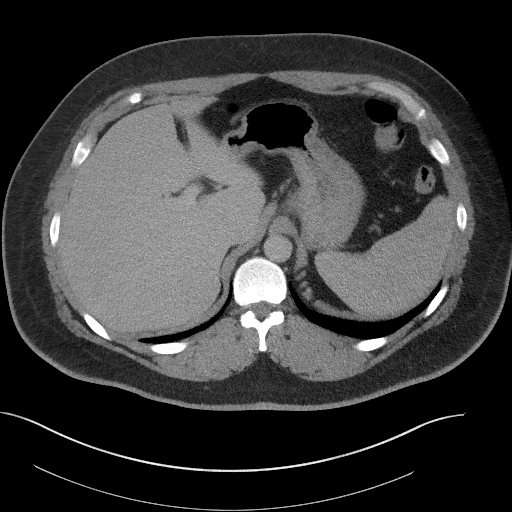
[im 91/104  soft-tissue]
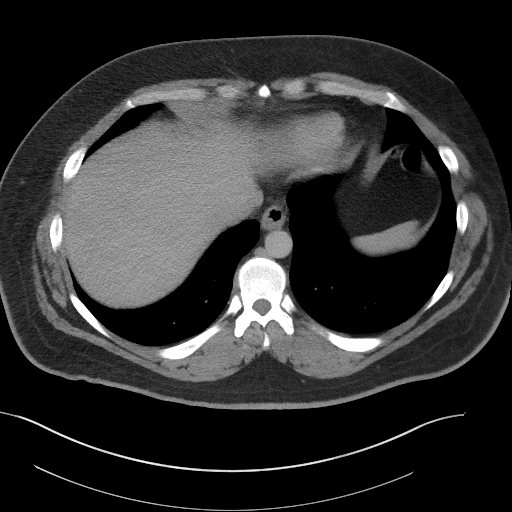
[im 99/104  soft-tissue]
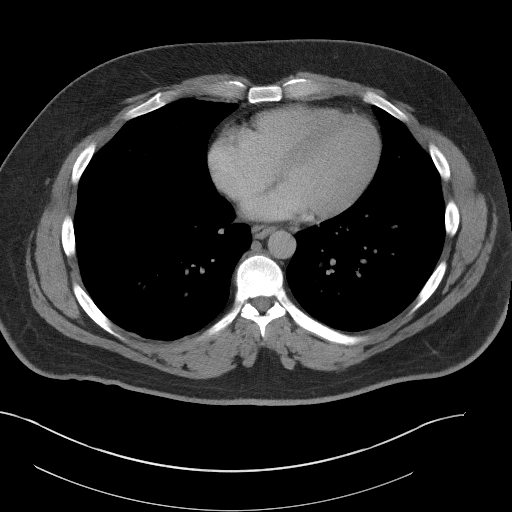

[Series 5: coronal st · coronal · 0.91mm/px · 3 of 111 slices shown]
[im 37/111  soft-tissue]
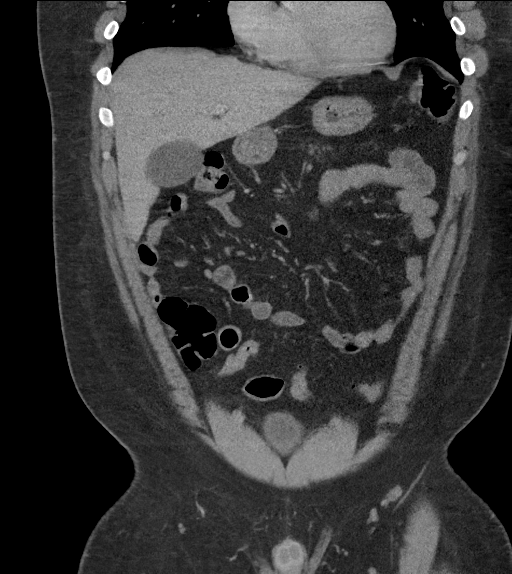
[im 49/111  soft-tissue]
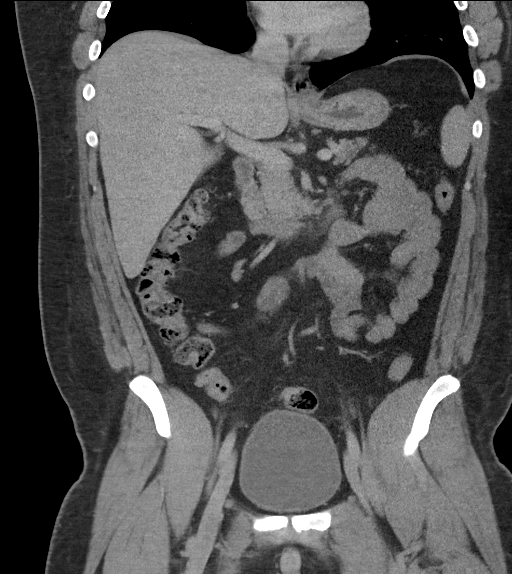
[im 62/111  soft-tissue]
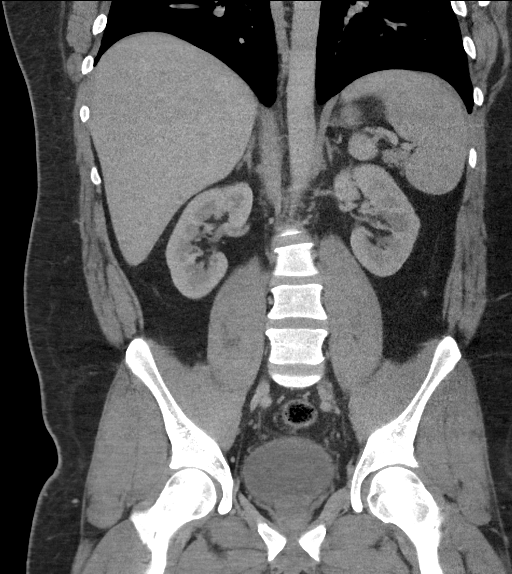

[16 of 46 positions shown; findings below may reference images not displayed]

FINDINGS: Lower chest: No significant pulmonary nodules or acute consolidative
airspace disease.

Hepatobiliary: Normal liver size. No liver mass. Normal gallbladder
with no radiopaque cholelithiasis. No biliary ductal dilatation.

Pancreas: Normal, with no mass or duct dilation.

Spleen: Normal size. No mass.

Adrenals/Urinary Tract: Normal adrenals. Normal kidneys with no
hydronephrosis and no renal mass. Normal bladder.

Stomach/Bowel: Normal non-distended stomach. Normal caliber small
bowel with no small bowel wall thickening. Appendectomy. Mild left
colonic diverticulosis, with no large bowel wall thickening or
significant pericolonic fat stranding.

Vascular/Lymphatic: Normal caliber abdominal aorta. Patent portal,
splenic, hepatic and renal veins. No pathologically enlarged lymph
nodes in the abdomen or pelvis.

Reproductive: Normal size prostate.

Other: No pneumoperitoneum, ascites or focal fluid collection.

Musculoskeletal: No aggressive appearing focal osseous lesions.
IMPRESSION: No acute abnormality. No evidence of bowel obstruction or acute
bowel inflammation. Mild left colonic diverticulosis, with no
evidence of acute diverticulitis.

## 2020-09-12 ENCOUNTER — Other Ambulatory Visit: Payer: Self-pay | Admitting: Family Medicine

## 2020-09-14 ENCOUNTER — Other Ambulatory Visit: Payer: Self-pay | Admitting: Family Medicine

## 2020-10-10 ENCOUNTER — Encounter: Payer: Self-pay | Admitting: Family Medicine

## 2020-10-10 NOTE — Telephone Encounter (Signed)
Both Pfizer and Gala Murdoch are good vaccines.  [facility] on [address] has Moderna in the health department more than likely has Pfizer  Either vaccine will do good at lessening the risk of severe outcomes  Thanks-Dr. Lorin Picket

## 2020-12-20 ENCOUNTER — Encounter: Payer: Self-pay | Admitting: Family Medicine

## 2020-12-20 ENCOUNTER — Other Ambulatory Visit: Payer: Self-pay

## 2020-12-20 ENCOUNTER — Ambulatory Visit: Payer: Commercial Managed Care - PPO | Admitting: Family Medicine

## 2020-12-20 VITALS — BP 128/82 | HR 68 | Temp 97.3°F | Ht 75.0 in | Wt 343.0 lb

## 2020-12-20 DIAGNOSIS — E785 Hyperlipidemia, unspecified: Secondary | ICD-10-CM

## 2020-12-20 DIAGNOSIS — Z114 Encounter for screening for human immunodeficiency virus [HIV]: Secondary | ICD-10-CM

## 2020-12-20 DIAGNOSIS — I10 Essential (primary) hypertension: Secondary | ICD-10-CM

## 2020-12-20 DIAGNOSIS — Z1159 Encounter for screening for other viral diseases: Secondary | ICD-10-CM | POA: Diagnosis not present

## 2020-12-20 MED ORDER — PANTOPRAZOLE SODIUM 40 MG PO TBEC: 40.0000 mg | DELAYED_RELEASE_TABLET | Freq: Every day | ORAL | 3 refills | Status: DC

## 2020-12-20 MED ORDER — SERTRALINE HCL 100 MG PO TABS: 100.0000 mg | ORAL_TABLET | Freq: Every day | ORAL | 1 refills | Status: DC

## 2020-12-20 MED ORDER — LISINOPRIL 2.5 MG PO TABS: 2.5000 mg | ORAL_TABLET | Freq: Every day | ORAL | 1 refills | Status: DC

## 2020-12-20 NOTE — Patient Instructions (Signed)

## 2020-12-20 NOTE — Progress Notes (Signed)
   Subjective:    Patient ID: Donald [name], male    DOB: 08-11-1985, 35 y.o.   MRN: [id number]  Hypertension This is a chronic problem. Treatments tried: lisinopril.  Psoriasis Medication refill   Patient for blood pressure check up.  The patient does have hypertension.    Medication compliance-compliant with medication  Blood pressure control recently-does not check blood pressure outside the office  Dietary compliance-tries to be healthy with eating  States no chest tightness pressure pain Minimizes salt in diet Stays active with his job   Review of Systems     Objective:   Physical Exam  General-in no acute distress Eyes-no discharge Lungs-respiratory rate normal, CTA CV-no murmurs,RRR Extremities skin warm dry no edema Neuro grossly normal Behavior normal, alert  Blood pressure rechecked overall good Follow-up again in 6 months Do lab work in the near future    Assessment & Plan:  HTN- patient seen for follow-up regarding HTN.  Diet, medication compliance, appropriate labs and refills were completed.  Importance of keeping blood pressure under good control to lessen the risk of complications discussed Continue medication. Moods overall doing well patient desires to continue his medication Does not use Xanax currently

## 2021-01-11 LAB — BASIC METABOLIC PANEL
BUN/Creatinine Ratio: 19 (ref 9–20)
BUN: 20 mg/dL (ref 6–20)
CO2: 24 mmol/L (ref 20–29)
Calcium: 9.8 mg/dL (ref 8.7–10.2)
Chloride: 106 mmol/L (ref 96–106)
Creatinine, Ser: 1.07 mg/dL (ref 0.76–1.27)
Glucose: 81 mg/dL (ref 70–99)
Potassium: 4.5 mmol/L (ref 3.5–5.2)
Sodium: 144 mmol/L (ref 134–144)
eGFR: 92 mL/min/{1.73_m2} (ref >59)

## 2021-01-11 LAB — LIPID PANEL
Chol/HDL Ratio: 5 ratio (ref 0.0–5.0)
Cholesterol, Total: 222 mg/dL — ABNORMAL HIGH (ref 100–199)
HDL: 44 mg/dL (ref >39)
LDL Chol Calc (NIH): 142 mg/dL — ABNORMAL HIGH (ref 0–99)
Triglycerides: 202 mg/dL — ABNORMAL HIGH (ref 0–149)
VLDL Cholesterol Cal: 36 mg/dL (ref 5–40)

## 2021-01-11 LAB — HIV ANTIBODY (ROUTINE TESTING W REFLEX): HIV Screen 4th Generation wRfx: NONREACTIVE

## 2021-01-11 LAB — HEPATIC FUNCTION PANEL
ALT: 25 IU/L (ref 0–44)
AST: 20 IU/L (ref 0–40)
Albumin: 4.5 g/dL (ref 4.0–5.0)
Alkaline Phosphatase: 82 IU/L (ref 44–121)
Bilirubin Total: 0.3 mg/dL (ref 0.0–1.2)
Bilirubin, Direct: 0.1 mg/dL (ref 0.00–0.40)
Total Protein: 7 g/dL (ref 6.0–8.5)

## 2021-01-11 LAB — HEPATITIS C ANTIBODY: Hep C Virus Ab: <0.1 s/co ratio (ref 0.0–0.9)

## 2021-05-26 ENCOUNTER — Ambulatory Visit: Payer: Commercial Managed Care - PPO | Admitting: Adult Health

## 2021-06-24 ENCOUNTER — Ambulatory Visit: Payer: Self-pay | Admitting: Nurse Practitioner

## 2021-06-30 ENCOUNTER — Ambulatory Visit (INDEPENDENT_AMBULATORY_CARE_PROVIDER_SITE_OTHER): Payer: PRIVATE HEALTH INSURANCE | Admitting: Family Medicine

## 2021-06-30 VITALS — BP 132/78 | HR 92 | Temp 97.9°F | Ht 75.0 in | Wt 363.2 lb

## 2021-06-30 DIAGNOSIS — T148XXA Other injury of unspecified body region, initial encounter: Secondary | ICD-10-CM | POA: Diagnosis not present

## 2021-06-30 NOTE — Patient Instructions (Addendum)
Discussion of GLP-1 medications  The medicine Wegovy and Saxenda Please talk with your insurance company to find out if these medicines are covered.  If they are covered we will be happy to initiate these medicines.  Please update [place] accordingly.  TakeCare-Dr. Lorin Picket  Please remember these medications are to assist with weight reduction and diabetes control.  They do not take the place of healthy eating and regular physical activity. There are several GLP-1 medications that can help with weight reduction and diabetes control.  GLP-1 medications with indication for diabetes-Trulicity, Victoza, Bydureon , Mounjaro, Ozempic, and Rybelsus (although these are injected medicines except for Rybelsus which is a pill) GLP-1 medications with indications for weight loss-Wegovy, and Saxenda  Mechanism of action  These medications stimulate glucagon peptide receptors.  By doing so it does the following:  1-slows down stomach emptying-essentially food takes longer to go through the stomach and the intestines-this can lessen appetite  2-reduces glucagon secretion from the pancreas-this helps keep blood glucose levels stable between meals  3-increase his insulin release from the pancreas-with diabetics this helps keep blood glucose levels stable  4-promotes the feeling of being full in the brain-encouraged him receptors in the brain received the signal so the brain and body know that it is time to stop eating Benefits of the medication  1-reduced weight-reduction of weight is more significant at higher dosing.  Not as much weight reduction with Rybelsus   A- typically Wegovy at higher doses can assist with significant weight reduction.  2-improved blood glucose levels-with diabetics typically we see a significant drop with A1c when the medication is adjusted appropriately.  3-decrease risk of heart attacks and strokes-individuals with type 2 diabetes are at increased risk of heart attacks and strokes.  These  GLP-1 medications can reduce the risk by 22%.  Risk of GLP-1 medications-these are some of the risks.  It is important to talk with your provider in a shared discussion before starting any medication.  Contraindications to GLP-1 medications-in other words who should not take these.  1-individuals with history of thyroid medullary cancer  2-individuals with family history of multiple endocrine neoplasm syndrome type II (MEN 2)  3-individuals with a history of pancreatitis  4-those with a history of severe hypersensitivity or allergy reactions to GLP-1 medications  5-should be avoided in individuals with a history of suicidal attempts or active suicidal ideation.  Cautions include- 1- risk of thyroid C-cell tumors were seen in rats during clinical testing in humans the relevancy of this information has not been determined 2-risk of pancreatitis including fatal and nonfatal hemorrhagic or necrotizing pancreatitis 3-gallbladder disease slightly increased risk of gallstones 4-hypoglycemia-more common with individuals who are on insulin or sulfonylurea such as glipizide 5-acute kidney injury or worsening of chronic renal failure often this is triggered by severe vomiting and diarrhea as side effects to GLP-1. 6-slightly increased risk of diabetic retinopathy complications in patients with type 2 diabetes 7-heart rate increase-slight increase of base heart rate with GLP-1 8-suicidal behavior and ideation has been reported in clinical trials with other weight loss medicines.  If depression occurs with medication or suicidal ideation stop medication immediately notify provider or seek help.  Common side effects include nausea, vomiting, diarrhea, constipation and increased heart rate.  Less common side effects severe abdominal pain-if this occurs notify provider stop medication get tested for pancreatitis. Full discussion with your provider should occur before starting these medicines.

## 2021-06-30 NOTE — Progress Notes (Signed)
   Subjective:    Patient ID: Donald [name], male    DOB: 07-29-1985, 36 y.o.   MRN: [id number]  HPI  Patient got bit by dog a week ago on right thumb. Pt went to Urgent Care and 1 stitch was done. Patient states he has intermittent pains and intermittent numbness and tingling in the thumb denies any particular loss of function  Pt doesn't think it's healing properly.  In addition to this patient has morbid obesity tries to watch his portion trying to stay active but continues to gain weight he is interested in trying a GLP-1 medication Review of Systems     Objective:   Physical Exam General-in no acute distress Eyes-no discharge Lungs-respiratory rate normal, CTA CV-no murmurs,RRR Extremities skin warm dry no edema Neuro grossly normal Behavior normal, alert  The hand has multiple small bites with 1 stitch No sign of infection More than likely nerve root contusion See discussion below Follow-up if progressive troubles       Assessment & Plan:  Dog bite on the hand Nerve root contusion Numbness could stay around for a few weeks then gradually get better or could be permanent Sutures to be removed on Friday patient states he will take it out proper way to remove stitch was discussed that patient would like to have it removed with our help patient is to come back on Friday  We also discussed morbid obesity patient trying to watch portions stay active difficult for him to lose weight. GLP-1 medications discussed.  He will look into see if this is covered by his insurance

## 2021-07-01 ENCOUNTER — Encounter: Payer: Self-pay | Admitting: Family Medicine

## 2021-07-03 ENCOUNTER — Telehealth: Payer: Self-pay

## 2021-07-03 MED ORDER — WEGOVY 0.25 MG/0.5ML ~~LOC~~ SOAJ: SUBCUTANEOUS | 0 refills | Status: DC

## 2021-07-03 NOTE — Telephone Encounter (Signed)
Caller name:Kaelyn Sharma Covert   On DPR? :Yes  Call back number:[phone]  Provider they see: Dr Lorin Picket   Reason for call:Pt medication was sent to the wrong pharmacy for Semaglutide-Weight Management (WEGOVY) 0.25 MG/0.5ML SOAJ  medication can be sent to [facility]

## 2021-07-03 NOTE — Telephone Encounter (Signed)
Patient informed of medication being sent to [facility] pharmacy. Verbalized understanding

## 2021-07-03 NOTE — Telephone Encounter (Signed)
Nurses I would recommend Wegovy because it is a weekly shot and tends to be slightly more effective than Saxenda  Starting dose of 0.25 mg once weekly May send in 1 month supply Donald [name] would need to give [place] feedback on week 3 how it is going, if he is tolerating the medicine it is recommended to step up to the next level which is 0.5 mg Each month we go up on the dose Medical guidelines state that a person must lose 5% of her body weight in the first 3 months of the medicine in order for most insurances to keep covering it. Therefore I recommend very healthy eating and regular physical activity while on this medicine 5% weight loss would be approximately 19 pounds  Also sometimes will resubmit medications we find out from the insurance company that there are extra stipulations or factors that must be addressed accordingly in order to be covered.(Some of these include approved for being under a physician's care for monthly visits for 6 months for dietary, weight, exercise counseling.  Uncertain if his insurance has that type of stipulation until we submit the prescription then obviously we will find out.(Unfortunately we do not make the rules that the insurance companies put forth)  It is also necessary to do a follow-up every 3 months while initiating Wegovy.  [name] should schedule a follow-up visit.  You may forward a copy of this message to [facility] as well Thanks-Dr. Lorin Picket

## 2021-07-16 ENCOUNTER — Telehealth: Payer: Self-pay | Admitting: *Deleted

## 2021-07-16 NOTE — Telephone Encounter (Addendum)
PA for [facility] Approved by insurance. Approval good 07/14/21-02/14/22  Patient notified.

## 2021-07-28 ENCOUNTER — Other Ambulatory Visit: Payer: Self-pay | Admitting: *Deleted

## 2021-07-28 ENCOUNTER — Telehealth: Payer: Self-pay | Admitting: *Deleted

## 2021-07-28 ENCOUNTER — Telehealth: Payer: Self-pay | Admitting: Family Medicine

## 2021-07-28 MED ORDER — SERTRALINE HCL 100 MG PO TABS: 100.0000 mg | ORAL_TABLET | Freq: Every day | ORAL | 1 refills | Status: DC

## 2021-07-28 MED ORDER — SILDENAFIL CITRATE 100 MG PO TABS
ORAL_TABLET | ORAL | 5 refills | Status: DC
Start: 2021-07-28 — End: 2021-07-30

## 2021-07-28 NOTE — Telephone Encounter (Signed)
For weight purposes the only 2 GLP-1 medications that are approved are RAXENM and Jana Hakim is a daily shot whereas Reginal Lutes is once a week shot  May have 6 refills on sildenafil

## 2021-07-28 NOTE — Telephone Encounter (Signed)
Refill sent to pharmacy.   

## 2021-07-28 NOTE — Telephone Encounter (Signed)
Refill request from [facility] requesting refill on Sertraline tablets 100 mg. Pt last seen 06/30/21 for obesity. Please advise. Thank you

## 2021-07-28 NOTE — Telephone Encounter (Signed)
Patient says he needs a refill on his Sildenafil. Last seen on 06/30/21. He also wants to know if there is an alternative to the [facility] because it is on back order. Please advise. Thank you

## 2021-07-28 NOTE — Telephone Encounter (Signed)
Patient says that he would like to try the saxenda. Please advise. Thank you

## 2021-07-28 NOTE — Telephone Encounter (Signed)
May have 6 months on refill ?

## 2021-07-28 NOTE — Telephone Encounter (Signed)
Prescription sent

## 2021-07-30 ENCOUNTER — Other Ambulatory Visit: Payer: Self-pay

## 2021-07-30 MED ORDER — SILDENAFIL CITRATE 100 MG PO TABS: ORAL_TABLET | ORAL | 5 refills | Status: DC

## 2021-07-30 MED ORDER — SAXENDA 18 MG/3ML ~~LOC~~ SOPN: PEN_INJECTOR | SUBCUTANEOUS | 1 refills | Status: DC

## 2021-07-30 MED ORDER — PEN NEEDLES 32G X 6 MM MISC: 1 refills | Status: DC

## 2021-07-30 NOTE — Telephone Encounter (Signed)
My chart message sent , prescriptions sent to pharmacy.

## 2021-07-30 NOTE — Telephone Encounter (Signed)
Donald [name] is a daily shot Starts off at 0.6 mg once a day for 1 week then goes up by 0.6 on a weekly basis until reaching maximum of 3 mg a day Patient should watch for any serious side effects if having severe abdominal pain vomiting or unable to tolerate it to let us know otherwise nausea is a common side effect but hopefully over time that improves If any questions let us know Please send in Saxenda with the directions above with the goal of getting to 3 mg/day Donald [name] will need to have the shots and then but also believe they will need to send in needle tips as well please talk with pharmacy regarding that Finally I recommend a follow-up office visit in 3 months Recommend healthy eating portion control

## 2021-07-31 ENCOUNTER — Other Ambulatory Visit: Payer: Self-pay

## 2021-07-31 NOTE — Telephone Encounter (Signed)
Called [employer] and cancelled saxenda.

## 2021-09-11 ENCOUNTER — Other Ambulatory Visit: Payer: Self-pay | Admitting: Family Medicine

## 2021-09-11 NOTE — Telephone Encounter (Signed)
In this situation if he is tolerating the Wegovy at the low-dose it is recommended to go up to the next dose which is 0.5 mg he can have 1 month supply with 1 refill I would recommend a follow-up visit later this fall if over the next few weeks he is tolerating this well then the next step would be is to increase the one-step higher to 1 mg

## 2021-09-12 ENCOUNTER — Other Ambulatory Visit: Payer: Self-pay | Admitting: *Deleted

## 2021-09-12 MED ORDER — SEMAGLUTIDE-WEIGHT MANAGEMENT 0.5 MG/0.5ML ~~LOC~~ SOAJ: 0.5000 mg | SUBCUTANEOUS | 1 refills | Status: DC

## 2021-09-12 NOTE — Telephone Encounter (Signed)
Pt contacted. Pt states he is doing well on the low dose 0.25 mg. Script for 0.5 mg sent to [facility]. Pt verbalized understanding.

## 2021-10-16 ENCOUNTER — Other Ambulatory Visit: Payer: Self-pay | Admitting: Family Medicine

## 2021-11-19 ENCOUNTER — Ambulatory Visit (INDEPENDENT_AMBULATORY_CARE_PROVIDER_SITE_OTHER): Payer: PRIVATE HEALTH INSURANCE | Admitting: Family Medicine

## 2021-11-19 VITALS — BP 124/68 | Ht 74.0 in | Wt 336.6 lb

## 2021-11-19 DIAGNOSIS — Z23 Encounter for immunization: Secondary | ICD-10-CM | POA: Diagnosis not present

## 2021-11-19 DIAGNOSIS — I10 Essential (primary) hypertension: Secondary | ICD-10-CM | POA: Diagnosis not present

## 2021-11-19 DIAGNOSIS — Z0000 Encounter for general adult medical examination without abnormal findings: Secondary | ICD-10-CM | POA: Diagnosis not present

## 2021-11-19 MED ORDER — LISINOPRIL 2.5 MG PO TABS: 2.5000 mg | ORAL_TABLET | Freq: Every day | ORAL | 1 refills | Status: DC

## 2021-11-19 MED ORDER — PANTOPRAZOLE SODIUM 40 MG PO TBEC
40.0000 mg | DELAYED_RELEASE_TABLET | Freq: Every day | ORAL | 3 refills | Status: DC
Start: 2021-11-19 — End: 2022-09-23

## 2021-11-19 MED ORDER — WEGOVY 1 MG/0.5ML ~~LOC~~ SOAJ: SUBCUTANEOUS | 2 refills | Status: DC

## 2021-11-19 MED ORDER — SERTRALINE HCL 100 MG PO TABS: 100.0000 mg | ORAL_TABLET | Freq: Every day | ORAL | 1 refills | Status: DC

## 2021-11-19 NOTE — Progress Notes (Signed)
   Subjective:    Patient ID: Donald [name], male    DOB: 03-28-1985, 36 y.o.   MRN: [id number]  HPI The patient comes in today for a wellness visit. Patient is on Wegovy and it is helping him lose weight he is lost over 30 pounds He is trying to watch his portions He states he is open to increasing the dose of his medicine   A review of their health history was completed.  A review of medications was also completed.  Any needed refills; increase Wegovy?  Eating habits: Good  Falls/  MVA accidents in past few months: none  Regular exercise:   Specialist pt sees on regular basis: none  Preventative health issues were discussed.   Additional concerns:     Review of Systems     Objective:   Physical Exam  General-in no acute distress Eyes-no discharge Lungs-respiratory rate normal, CTA CV-no murmurs,RRR Extremities skin warm dry no edema Neuro grossly normal Behavior normal, alert       Assessment & Plan:  1. Well adult exam Adult wellness-complete.wellness physical was conducted today. Importance of diet and exercise were discussed in detail.  Importance of stress reduction and healthy living were discussed.  In addition to this a discussion regarding safety was also covered.  We also reviewed over immunizations and gave recommendations regarding current immunization needed for age.   In addition to this additional areas were also touched on including: Preventative health exams needed:  Colonoscopy not indicated currently  Patient was advised yearly wellness exam   2. Need for vaccination Flu shot today - Flu Vaccine QUAD 6+ mos PF IM (Fluarix Quad PF)  3. Morbid obesity (HCC) Continue Wegovy increased dose  4. Primary hypertension Blood pressure good control continue current measures  Follow-up 4 months

## 2021-12-09 ENCOUNTER — Encounter: Payer: Self-pay | Admitting: Family Medicine

## 2021-12-09 NOTE — Telephone Encounter (Signed)
Nurses nurses Go ahead with 1.7 mg Wegovy once per week 1 month supply, 3 refills Patient should weigh himself weekly and record these weights He should send [place] a monthly update with his weekly weights. He has a follow-up visit in the spring very important to keep this Call us if any problems If doing very well with the 1.7 in 1 month if he is interested in going up the next level is the top level at 2.4

## 2021-12-10 MED ORDER — SEMAGLUTIDE-WEIGHT MANAGEMENT 1.7 MG/0.75ML ~~LOC~~ SOAJ: SUBCUTANEOUS | 3 refills | Status: DC

## 2021-12-12 ENCOUNTER — Other Ambulatory Visit: Payer: Self-pay

## 2021-12-12 ENCOUNTER — Telehealth: Payer: Self-pay | Admitting: Family Medicine

## 2021-12-12 DIAGNOSIS — Z79899 Other long term (current) drug therapy: Secondary | ICD-10-CM

## 2021-12-12 DIAGNOSIS — E785 Hyperlipidemia, unspecified: Secondary | ICD-10-CM

## 2021-12-12 DIAGNOSIS — I10 Essential (primary) hypertension: Secondary | ICD-10-CM

## 2021-12-12 NOTE — Telephone Encounter (Signed)
I would recommend CBC, metabolic 7, lipid, liver  Check with patient see if he has a form that needs to be filled out.  If the form also has A1c on the form please order A1c thank you

## 2021-12-12 NOTE — Telephone Encounter (Signed)
Patient informed and lab orders placed, states no A1c is needed on his form.

## 2021-12-12 NOTE — Telephone Encounter (Signed)
Patient is needing labs done for recent physical on 11/8 for his insurance to pay for his physical.He has a follow up appointment on 12/19 recheck his blood pressure

## 2021-12-12 NOTE — Telephone Encounter (Signed)
Last labs completed 01/10/21 HIV, Hep C, Hepatic, Lipid and BMET. Please advise. Thank you

## 2021-12-18 LAB — BASIC METABOLIC PANEL
BUN/Creatinine Ratio: 13 (ref 9–20)
BUN: 12 mg/dL (ref 6–20)
CO2: 22 mmol/L (ref 20–29)
Calcium: 9.5 mg/dL (ref 8.7–10.2)
Chloride: 101 mmol/L (ref 96–106)
Creatinine, Ser: 0.94 mg/dL (ref 0.76–1.27)
Glucose: 100 mg/dL — ABNORMAL HIGH (ref 70–99)
Potassium: 4.5 mmol/L (ref 3.5–5.2)
Sodium: 137 mmol/L (ref 134–144)
eGFR: 106 mL/min/{1.73_m2} (ref >59)

## 2021-12-18 LAB — HEPATIC FUNCTION PANEL
ALT: 18 IU/L (ref 0–44)
AST: 14 IU/L (ref 0–40)
Albumin: 4.5 g/dL (ref 4.1–5.1)
Alkaline Phosphatase: 74 IU/L (ref 44–121)
Bilirubin Total: 0.5 mg/dL (ref 0.0–1.2)
Bilirubin, Direct: 0.16 mg/dL (ref 0.00–0.40)
Total Protein: 7 g/dL (ref 6.0–8.5)

## 2021-12-18 LAB — CBC WITH DIFFERENTIAL/PLATELET
Basophils Absolute: 0.1 10*3/uL (ref 0.0–0.2)
Basos: 1 %
EOS (ABSOLUTE): 0.2 10*3/uL (ref 0.0–0.4)
Eos: 2 %
Hematocrit: 44.6 % (ref 37.5–51.0)
Hemoglobin: 14.6 g/dL (ref 13.0–17.7)
Immature Grans (Abs): 0 10*3/uL (ref 0.0–0.1)
Immature Granulocytes: 0 %
Lymphocytes Absolute: 2.6 10*3/uL (ref 0.7–3.1)
Lymphs: 25 %
MCH: 26.4 pg — ABNORMAL LOW (ref 26.6–33.0)
MCHC: 32.7 g/dL (ref 31.5–35.7)
MCV: 81 fL (ref 79–97)
Monocytes Absolute: 0.7 10*3/uL (ref 0.1–0.9)
Monocytes: 7 %
Neutrophils Absolute: 6.7 10*3/uL (ref 1.4–7.0)
Neutrophils: 65 %
Platelets: 342 10*3/uL (ref 150–450)
RBC: 5.52 x10E6/uL (ref 4.14–5.80)
RDW: 12.7 % (ref 11.6–15.4)
WBC: 10.3 10*3/uL (ref 3.4–10.8)

## 2021-12-18 LAB — LIPID PANEL
Chol/HDL Ratio: 4.4 ratio (ref 0.0–5.0)
Cholesterol, Total: 184 mg/dL (ref 100–199)
HDL: 42 mg/dL (ref >39)
LDL Chol Calc (NIH): 123 mg/dL — ABNORMAL HIGH (ref 0–99)
Triglycerides: 102 mg/dL (ref 0–149)
VLDL Cholesterol Cal: 19 mg/dL (ref 5–40)

## 2021-12-30 ENCOUNTER — Encounter: Payer: Self-pay | Admitting: Family Medicine

## 2021-12-30 ENCOUNTER — Ambulatory Visit: Payer: PRIVATE HEALTH INSURANCE | Admitting: Family Medicine

## 2021-12-30 DIAGNOSIS — I10 Essential (primary) hypertension: Secondary | ICD-10-CM | POA: Diagnosis not present

## 2021-12-30 MED ORDER — WEGOVY 2.4 MG/0.75ML ~~LOC~~ SOAJ: 2.4000 mg | SUBCUTANEOUS | 3 refills | Status: DC

## 2021-12-30 NOTE — Progress Notes (Signed)
   Subjective:    Patient ID: Donald [name], male    DOB: [birth year], 36 y.o.   MRN: [id number]  HPI Morbid obesity (HCC) - Plan: Semaglutide-Weight Management (WEGOVY) 2.4 MG/0.75ML SOAJ  Primary hypertension  Patient for blood pressure check up.  The patient does have hypertension.   Patient relates dietary measures try to minimize salt The importance of healthy diet and activity were discussed Patient relates compliance  Patient with morbid obesity The patient's BMI is calculated.  The patient does have obesity.  The patient does try to some degree staying active and watching diet.  It is in the vital signs and acknowledged.  It is above the recommended BMI for the patient's height and weight.  The patient has been counseled regarding healthy diet, restricted portions, avoiding excessive carbohydrates/sugary foods, and increase physical activity as health permits.  It is in the patient's best interest to lower the risk of secondary illness including heart disease strokes and cancer by losing weight.  The patient acknowledges this information.  Patient has been counseled to eat healthier He does take his 33.  Is losing weight.   Review of Systems     Objective:   Physical Exam  General-in no acute distress Eyes-no discharge Lungs-respiratory rate normal, CTA CV-no murmurs,RRR Extremities skin warm dry no edema Neuro grossly normal Behavior normal, alert       Assessment & Plan:  1. Morbid obesity (HCC) Doing well with the [facility] tolerating it well bump up the dose instead of 1.71 next prescription over 2.4 side effects discussed warning signs discussed follow-up 3 months - Semaglutide-Weight Management (WEGOVY) 2.4 MG/0.75ML SOAJ; Inject 2.4 mg into the skin once a week for 16 doses.  Dispense: 3 mL; Refill: 3  2. Primary hypertension HTN- patient seen for follow-up regarding HTN.   Diet, medication compliance, appropriate labs and refills were completed.    Importance of keeping blood pressure under good control to lessen the risk of complications discussed Regular follow-up visits discussed

## 2022-01-13 ENCOUNTER — Telehealth: Payer: PRIVATE HEALTH INSURANCE | Admitting: Family Medicine

## 2022-01-13 ENCOUNTER — Ambulatory Visit: Payer: PRIVATE HEALTH INSURANCE | Admitting: Family Medicine

## 2022-01-13 VITALS — BP 137/75 | HR 80 | Temp 98.7°F | Ht 74.0 in | Wt 319.0 lb

## 2022-01-13 DIAGNOSIS — R6889 Other general symptoms and signs: Secondary | ICD-10-CM | POA: Diagnosis not present

## 2022-01-13 DIAGNOSIS — B349 Viral infection, unspecified: Secondary | ICD-10-CM

## 2022-01-13 MED ORDER — ALBUTEROL SULFATE HFA 108 (90 BASE) MCG/ACT IN AERS: 2.0000 | INHALATION_SPRAY | Freq: Four times a day (QID) | RESPIRATORY_TRACT | 0 refills | Status: DC | PRN

## 2022-01-13 NOTE — Progress Notes (Signed)
E visit for Flu like symptoms   We are sorry that you are not feeling well.  Here is how we plan to help! Based on what you have shared with me it looks like you may have flu-like symptoms that should be watched but do not seem to indicate anti-viral treatment.  Influenza or "the flu" is   an infection caused by a respiratory virus. The flu virus is highly contagious and persons who did not receive their yearly flu vaccination may "catch" the flu from close contact.  We have anti-viral medications to treat the viruses that cause this infection. They are not a "cure" and only shorten the course of the infection. These prescriptions are most effective when they are given within the first 2 days of "flu" symptoms.   I will order a inhaler to use to help with opening your airway more.   Based upon your symptoms and potential risk factors I recommend that you follow the flu symptoms recommendation that I have listed below.  ANYONE WHO HAS FLU SYMPTOMS SHOULD: Stay home. The flu is highly contagious and going out or to work exposes others! Be sure to drink plenty of fluids. Water is fine as well as fruit juices, sodas and electrolyte beverages. You may want to stay away from caffeine or alcohol. If you are nauseated, try taking small sips of liquids. How do you know if you are getting enough fluid? Your urine should be a pale yellow or almost colorless. Get rest. Taking a steamy shower or using a humidifier may help nasal congestion and ease sore throat pain. Using a saline nasal spray works much the same way. Cough drops, hard candies and sore throat lozenges may ease your cough. Line up a caregiver. Have someone check on you regularly.   GET HELP RIGHT AWAY IF: You cannot keep down liquids or your medications. You become short of breath Your fell like you are going to pass out or loose consciousness. Your symptoms persist after you have completed your treatment plan MAKE SURE YOU  Understand  these instructions. Will watch your condition. Will get help right away if you are not doing well or get worse.  Your e-visit answers were reviewed by a board certified advanced clinical practitioner to complete your personal care plan.  Depending on the condition, your plan could have included both over the counter or prescription medications.  If there is a problem please reply  once you have received a response from your provider.  Your safety is important to [place].  If you have drug allergies check your prescription carefully.    You can use MyChart to ask questions about today's visit, request a non-urgent call back, or ask for a work or school excuse for 24 hours related to this e-Visit. If it has been greater than 24 hours you will need to follow up with your provider, or enter a new e-Visit to address those concerns.  You will get an e-mail in the next two days asking about your experience.  I hope that your e-visit has been valuable and will speed your recovery. Thank you for using e-visits.  I provided 5 minutes of non face-to-face time during this encounter for chart review, medication and order placement, as well as and documentation.

## 2022-01-13 NOTE — Progress Notes (Addendum)
   Subjective:    Patient ID: Donald [name], male    DOB: 17-Dec-1985, 37 y.o.   MRN: [id number]  HPI Body aches, sore throat, chills, ear aches, chest tightness, low grade fever-x 2 days taking mucinex, no covid testing done Body aches not feeling good symptoms over the past few days low energy.  No nausea or vomiting  The patient has morbid obesity.  He does try to do the best he can at watching his diet.  He does state that he stays physically active.  Is watching portions and choices.  Is tolerating the medicine well.  He is currently taking Wegovy 2.4 weekly.  His weight has gone from 263 down to 319.  The medicine is benefiting him and is medically necessary. Review of Systems     Objective:   Physical Exam  Gen-NAD not toxic TMS-normal bilateral T- normal no redness Chest-CTA respiratory rate normal no crackles CV RRR no murmur Skin-warm dry Neuro-grossly normal       Assessment & Plan:  He works a very physical job work excuse given for the rest of the week Warning signs discussed Triple swab taken  Viral syndrome hold off on antibiotics no need for x-rays lab work currently

## 2022-01-14 LAB — COVID-19, FLU A+B AND RSV
Influenza A, NAA: NOT DETECTED
Influenza B, NAA: NOT DETECTED
RSV, NAA: NOT DETECTED
SARS-CoV-2, NAA: NOT DETECTED

## 2022-01-17 ENCOUNTER — Encounter: Payer: Self-pay | Admitting: Family Medicine

## 2022-01-21 MED ORDER — WEGOVY 2.4 MG/0.75ML ~~LOC~~ SOAJ: 2.4000 mg | SUBCUTANEOUS | 3 refills | Status: AC

## 2022-01-21 NOTE — Telephone Encounter (Signed)
Nurses I checked electronic record Wegovy 2.4 was sent in December Please send it again with 1 month supply with 3 refills thank you-Dr. Peri Mar[facility] pharmacy

## 2022-01-28 ENCOUNTER — Telehealth: Payer: Self-pay | Admitting: Family Medicine

## 2022-01-28 NOTE — Telephone Encounter (Signed)
PA approved for Wegovy 2.4 mg. Approved from 01/26/22-07/27/22. Pt contacted and made aware. [place] made aware also.

## 2022-03-19 ENCOUNTER — Ambulatory Visit: Payer: PRIVATE HEALTH INSURANCE | Admitting: Family Medicine

## 2022-05-28 ENCOUNTER — Encounter: Payer: Self-pay | Admitting: Family Medicine

## 2022-05-28 ENCOUNTER — Telehealth: Payer: Self-pay

## 2022-05-28 NOTE — Telephone Encounter (Signed)
Pt said for about a week when he gets up or bends over he gets dizzy and light headed. He is concerned may be his blood pressure med   [place] [phone]

## 2022-05-28 NOTE — Telephone Encounter (Signed)
Ideally he should check his blood pressure It is possible it needs adjustment Unfortunately no availability today Please set up appointment for next week If he has ability to check his blood pressure he should do so If for some reason it is exceptionally low he can hold his blood pressure medicine

## 2022-05-28 NOTE — Telephone Encounter (Signed)
See my chart message - patient aware and front will reach out to patient to schedule office visit next week.

## 2022-05-29 NOTE — Telephone Encounter (Signed)
May stop medication Recommend follow-up visit next week Please assist getting an appointment for them

## 2022-06-01 ENCOUNTER — Encounter: Payer: Self-pay | Admitting: Family Medicine

## 2022-06-01 ENCOUNTER — Ambulatory Visit: Payer: PRIVATE HEALTH INSURANCE | Admitting: Family Medicine

## 2022-06-01 VITALS — Temp 98.4°F | Ht 74.0 in | Wt 292.0 lb

## 2022-06-01 DIAGNOSIS — R42 Dizziness and giddiness: Secondary | ICD-10-CM | POA: Diagnosis not present

## 2022-06-01 DIAGNOSIS — H8390 Unspecified disease of inner ear, unspecified ear: Secondary | ICD-10-CM

## 2022-06-01 DIAGNOSIS — M7989 Other specified soft tissue disorders: Secondary | ICD-10-CM | POA: Diagnosis not present

## 2022-06-01 MED ORDER — WEGOVY 2.4 MG/0.75ML ~~LOC~~ SOAJ: SUBCUTANEOUS | 6 refills | Status: DC

## 2022-06-01 NOTE — Progress Notes (Signed)
   Subjective:    Patient ID: Donald [name], male    DOB: 20-Oct-1985, 37 y.o.   MRN: [id number]  HPI Dizziness and light headedness 1.5 weeks when sudden movements and bending over Intermittent spells where he feels a little lightheaded more so when he bends over or when he turns quickly this been going on for the past week and a half he is also lost a fair amount of weight he is on Wegovy 2.4 every week it is helping him to reduce portions his weight is coming down recently stopped lisinopril few days ago upon our direction has not seen any improvement since doing so Cheked bp at [facility] 121/83, 118/83  Stopped lisinopril 3 days ago   Review of Systems     Objective:   Physical Exam Patient not orthostatic Lungs clear heart regular extremities no edema EOMI No nystagmus  Nodule soft tissue middle finger distal pulp     Assessment & Plan:  1. Light headedness Epley maneuver was shown name printed out he will try this on a regular basis if it does not dramatically help over the next 7 to 14 days he is to let us know  2. Disorder of inner ear, unspecified laterality See above I find no evidence of any stroke  3. Nodule of soft tissue Referral to hand specialist to have this looked at possibly removed - Ambulatory referral to Orthopedic Surgery  4. Morbid obesity (HCC) New Wegovy Getting benefits from it Follow-up 6 months Healthy diet regular physical activity

## 2022-06-18 ENCOUNTER — Other Ambulatory Visit: Payer: Self-pay | Admitting: Family Medicine

## 2022-06-18 ENCOUNTER — Other Ambulatory Visit: Payer: Self-pay | Admitting: Orthopedic Surgery

## 2022-08-28 ENCOUNTER — Telehealth: Payer: No Typology Code available for payment source | Admitting: Physician Assistant

## 2022-08-28 ENCOUNTER — Encounter: Payer: Self-pay | Admitting: Physician Assistant

## 2022-08-28 DIAGNOSIS — R197 Diarrhea, unspecified: Secondary | ICD-10-CM

## 2022-08-28 NOTE — Progress Notes (Signed)
I have spent 5 minutes in review of e-visit questionnaire, review and updating patient chart, medical decision making and response to patient.   William Cody Martin, PA-C    

## 2022-08-28 NOTE — Progress Notes (Signed)
E-Visit for Diarrhea  We are sorry that you are not feeling well.  Here is how we plan to help!  Based on what you have shared with me it looks like you have Acute Infectious Diarrhea.  Most cases of acute diarrhea are due to infections with virus and bacteria and are self-limited conditions lasting less than 14 days.  For your symptoms you may take Imodium 2 mg tablets that are over the counter at your local pharmacy. Take two tablet now and then one after each loose stool up to 6 a day.  Antibiotics are not needed for most people with diarrhea.    HOME CARE We recommend changing your diet to help with your symptoms for the next few days. Drink plenty of fluids that contain water salt and sugar. Sports drinks such as Gatorade may help.  You may try broths, soups, bananas, applesauce, soft breads, mashed potatoes or crackers.  You are considered infectious for as long as the diarrhea continues. Hand washing or use of alcohol based hand sanitizers is recommend. It is best to stay out of work or school until your symptoms stop.   GET HELP RIGHT AWAY If you have dark yellow colored urine or do not pass urine frequently you should drink more fluids.   If your symptoms worsen  If you feel like you are going to pass out (faint) You have a new problem  MAKE SURE YOU  Understand these instructions. Will watch your condition. Will get help right away if you are not doing well or get worse.  Thank you for choosing an e-visit.  Your e-visit answers were reviewed by a board certified advanced clinical practitioner to complete your personal care plan. Depending upon the condition, your plan could have included both over the counter or prescription medications.  Please review your pharmacy choice. Make sure the pharmacy is open so you can pick up prescription now. If there is a problem, you may contact your provider through [employer] and have the prescription routed to another pharmacy.   Your safety is important to [place]. If you have drug allergies check your prescription carefully.   For the next 24 hours you can use MyChart to ask questions about today's visit, request a non-urgent call back, or ask for a work or school excuse. You will get an email in the next two days asking about your experience. I hope that your e-visit has been valuable and will speed your recovery.

## 2022-09-23 ENCOUNTER — Ambulatory Visit
Admission: EM | Admit: 2022-09-23 | Discharge: 2022-09-23 | Disposition: A | Discharge status: Home / Self Care | Payer: No Typology Code available for payment source | Attending: Family Medicine | Admitting: Family Medicine

## 2022-09-23 ENCOUNTER — Encounter: Payer: Self-pay | Admitting: Emergency Medicine

## 2022-09-23 DIAGNOSIS — S39012A Strain of muscle, fascia and tendon of lower back, initial encounter: Secondary | ICD-10-CM | POA: Diagnosis not present

## 2022-09-23 MED ORDER — NAPROXEN 500 MG PO TABS: 500.0000 mg | ORAL_TABLET | Freq: Two times a day (BID) | ORAL | 0 refills | Status: DC

## 2022-09-23 MED ORDER — TIZANIDINE HCL 4 MG PO CAPS: 4.0000 mg | ORAL_CAPSULE | Freq: Three times a day (TID) | ORAL | 0 refills | Status: DC | PRN
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
## 2022-11-01 ENCOUNTER — Telehealth: Payer: 59 | Admitting: Physician Assistant

## 2022-11-01 DIAGNOSIS — R112 Nausea with vomiting, unspecified: Secondary | ICD-10-CM | POA: Diagnosis not present

## 2022-11-01 MED ORDER — ONDANSETRON HCL 4 MG PO TABS: 4.0000 mg | ORAL_TABLET | Freq: Three times a day (TID) | ORAL | 0 refills | Status: DC | PRN

## 2022-11-01 NOTE — Progress Notes (Signed)
E-Visit for Nausea and Vomiting   We are sorry that you are not feeling well. Here is how we plan to help!  Based on what you have shared with me it looks like you have a Virus that is irritating your GI tract.  Vomiting is the forceful emptying of a portion of the stomach's content through the mouth.  Although nausea and vomiting can make you feel miserable, it's important to remember that these are not diseases, but rather symptoms of an underlying illness.  When we treat short term symptoms, we always caution that any symptoms that persist should be fully evaluated in a medical office.  I have prescribed a medication that will help alleviate your symptoms and allow you to stay hydrated:  Zofran 4 mg 1 tablet every 8 hours as needed for nausea and vomiting  HOME CARE: Drink clear liquids.  This is very important! Dehydration (the lack of fluid) can lead to a serious complication.  Start off with 1 tablespoon every 5 minutes for 8 hours. You may begin eating bland foods after 8 hours without vomiting.  Start with saltine crackers, white bread, rice, mashed potatoes, applesauce. After 48 hours on a bland diet, you may resume a normal diet. Try to go to sleep.  Sleep often empties the stomach and relieves the need to vomit.  GET HELP RIGHT AWAY IF:  Your symptoms do not improve or worsen within 2 days after treatment. You have a fever for over 3 days. You cannot keep down fluids after trying the medication.  MAKE SURE YOU:  Understand these instructions. Will watch your condition. Will get help right away if you are not doing well or get worse.    Thank you for choosing an e-visit.  Your e-visit answers were reviewed by a board certified advanced clinical practitioner to complete your personal care plan. Depending upon the condition, your plan could have included both over the counter or prescription medications.  Please review your pharmacy choice. Make sure the pharmacy is open so  you can pick up prescription now. If there is a problem, you may contact your provider through MyChart messaging and have the prescription routed to another pharmacy.  Your safety is important to us. If you have drug allergies check your prescription carefully.   For the next 24 hours you can use MyChart to ask questions about today's visit, request a non-urgent call back, or ask for a work or school excuse. You will get an email in the next two days asking about your experience. I hope that your e-visit has been valuable and will speed your recovery.   I have spent 5 minutes in review of e-visit questionnaire, review and updating patient chart, medical decision making and response to patient.   Davi Kroon Z Ward, PA-C    

## 2022-11-09 ENCOUNTER — Telehealth: Payer: 59 | Admitting: Family Medicine

## 2022-11-10 ENCOUNTER — Telehealth: Payer: 59 | Admitting: Physician Assistant

## 2022-11-10 ENCOUNTER — Encounter: Payer: Self-pay | Admitting: Family Medicine

## 2022-11-10 DIAGNOSIS — T887XXA Unspecified adverse effect of drug or medicament, initial encounter: Secondary | ICD-10-CM

## 2022-11-10 DIAGNOSIS — R112 Nausea with vomiting, unspecified: Secondary | ICD-10-CM | POA: Diagnosis not present

## 2022-11-10 MED ORDER — ONDANSETRON 4 MG PO TBDP
4.0000 mg | ORAL_TABLET | Freq: Three times a day (TID) | ORAL | 0 refills | Status: DC | PRN
Start: 2022-11-10 — End: 2022-12-02

## 2022-11-10 NOTE — Progress Notes (Signed)
E-Visit for Nausea and Vomiting   We are sorry that you are not feeling well. Here is how we plan to help!  Based on what you have shared with me it looks like you having a medication side effect.  Vomiting is the forceful emptying of a portion of the stomach's content through the mouth.  Although nausea and vomiting can make you feel miserable, it's important to remember that these are not diseases, but rather symptoms of an underlying illness.  When we treat short term symptoms, we always caution that any symptoms that persist should be fully evaluated in a medical office.  I would recommend to not take [facility] any longer. This medication has to be titrated up to avoid and limit this side effect. The nausea and vomiting may last for up to a week. Small, frequent meals with easy to digest foods, even liquid diet for the next 24-48 hours to help staying hydrated.  I have prescribed a medication that will help alleviate your symptoms and allow you to stay hydrated:  Zofran 4 mg 1 tablet every 8 hours as needed for nausea and vomiting  HOME CARE: Drink clear liquids.  This is very important! Dehydration (the lack of fluid) can lead to a serious complication.  Start off with 1 tablespoon every 5 minutes for 8 hours. You may begin eating bland foods after 8 hours without vomiting.  Start with saltine crackers, white bread, rice, mashed potatoes, applesauce. After 48 hours on a bland diet, you may resume a normal diet. Try to go to sleep.  Sleep often empties the stomach and relieves the need to vomit.  GET HELP RIGHT AWAY IF:  Your symptoms do not improve or worsen within 2 days after treatment. You have a fever for over 3 days. You cannot keep down fluids after trying the medication.  MAKE SURE YOU:  Understand these instructions. Will watch your condition. Will get help right away if you are not doing well or get worse.    Thank you for choosing an e-visit.  Your e-visit answers were  reviewed by a board certified advanced clinical practitioner to complete your personal care plan. Depending upon the condition, your plan could have included both over the counter or prescription medications.  Please review your pharmacy choice. Make sure the pharmacy is open so you can pick up prescription now. If there is a problem, you may contact your provider through [employer] and have the prescription routed to another pharmacy.  Your safety is important to [place]. If you have drug allergies check your prescription carefully.   For the next 24 hours you can use MyChart to ask questions about today's visit, request a non-urgent call back, or ask for a work or school excuse. You will get an email in the next two days asking about your experience. I hope that your e-visit has been valuable and will speed your recovery.   I have spent 5 minutes in review of e-visit questionnaire, review and updating patient chart, medical decision making and response to patient.   Margaretann Loveless, PA-C

## 2022-11-12 ENCOUNTER — Ambulatory Visit: Payer: 59 | Admitting: Nurse Practitioner

## 2022-12-02 ENCOUNTER — Ambulatory Visit: Payer: 59 | Admitting: Family Medicine

## 2022-12-02 ENCOUNTER — Encounter: Payer: Self-pay | Admitting: Family Medicine

## 2022-12-02 DIAGNOSIS — E785 Hyperlipidemia, unspecified: Secondary | ICD-10-CM

## 2022-12-02 DIAGNOSIS — F439 Reaction to severe stress, unspecified: Secondary | ICD-10-CM | POA: Diagnosis not present

## 2022-12-02 MED ORDER — SERTRALINE HCL 100 MG PO TABS: ORAL_TABLET | ORAL | 1 refills | Status: DC

## 2022-12-02 MED ORDER — SEMAGLUTIDE-WEIGHT MANAGEMENT 0.25 MG/0.5ML ~~LOC~~ SOAJ: SUBCUTANEOUS | 4 refills | Status: DC

## 2022-12-03 ENCOUNTER — Encounter: Payer: 59 | Admitting: Family Medicine

## 2022-12-03 NOTE — Progress Notes (Signed)
   Subjective:    Patient ID: Donald [name], male    DOB: 1985/10/27, 37 y.o.   MRN: [id number]  HPI Morbid obesity (HCC)  Stress  Hyperlipidemia, unspecified hyperlipidemia type - Plan: Lipid Panel  Patient states under fair amount of stress this gets difficult to manage because he gets on edge and irritated with the least noise or activity but he denies being in depressed denies being suicidal denies being violent or anger issues  He does have morbid obesity tries portion control regular physical activity and he is unable to lose weight and his weights been going up he is interested in utilizing [employer] he states it did help him previously and states his insurance does cover it.  He is aware of the side effects he is aware of the importance to start at a low dose and gradually building up  Patient also does use Zoloft on a regular basis states it does good job for him he is willing to go up on the dose see if they will help his symptoms  Has hyperlipidemia trying to help through diet   Review of Systems     Objective:   Physical Exam General-in no acute distress Eyes-no discharge Lungs-respiratory rate normal, CTA CV-no murmurs,RRR Extremities skin warm dry no edema Neuro grossly normal Behavior normal, alert Morbid obesity noted       Assessment & Plan:  1. Morbid obesity (HCC) Significant obesity.  Very important to work hard on healthy diet regular physical activity.  Portion control and minimizing carbohydrates and excessive sweets.  Fitting and regular physical activity would be wise. We did discuss medications discussed side effects of Wegovy he is familiar with this he is interested in restarting states his insurance will cover it so therefore we will start Wegovy at the low dose I did explain to the patient if his insurance has changed to where they put a firm noncoverage to this there would be nothing we could do  2. Stress Increase the dose of Zoloft to try to  get better response patient to send [place] a MyChart note in 2 to 3 weeks let us know if that is helping if he feels it is causing problems go back to original dose and notify us  3. Hyperlipidemia, unspecified hyperlipidemia type Check cholesterol panel - Lipid Panel  Wellness visit in the future

## 2022-12-17 LAB — LIPID PANEL
Chol/HDL Ratio: 4.5 {ratio} (ref 0.0–5.0)
Cholesterol, Total: 239 mg/dL — ABNORMAL HIGH (ref 100–199)
HDL: 53 mg/dL (ref >39)
LDL Chol Calc (NIH): 166 mg/dL — ABNORMAL HIGH (ref 0–99)
Triglycerides: 112 mg/dL (ref 0–149)
VLDL Cholesterol Cal: 20 mg/dL (ref 5–40)

## 2022-12-23 ENCOUNTER — Encounter: Payer: Self-pay | Admitting: Family Medicine

## 2022-12-23 ENCOUNTER — Ambulatory Visit: Payer: 59 | Admitting: Family Medicine

## 2022-12-23 VITALS — BP 115/75 | HR 70 | Temp 97.3°F | Ht 74.0 in | Wt 324.0 lb

## 2022-12-23 DIAGNOSIS — F439 Reaction to severe stress, unspecified: Secondary | ICD-10-CM

## 2022-12-23 DIAGNOSIS — E785 Hyperlipidemia, unspecified: Secondary | ICD-10-CM | POA: Diagnosis not present

## 2022-12-23 DIAGNOSIS — Z23 Encounter for immunization: Secondary | ICD-10-CM

## 2022-12-23 DIAGNOSIS — Z0000 Encounter for general adult medical examination without abnormal findings: Secondary | ICD-10-CM

## 2022-12-23 DIAGNOSIS — Z0001 Encounter for general adult medical examination with abnormal findings: Secondary | ICD-10-CM | POA: Diagnosis not present

## 2022-12-23 MED ORDER — SEMAGLUTIDE-WEIGHT MANAGEMENT 0.5 MG/0.5ML ~~LOC~~ SOAJ: SUBCUTANEOUS | 2 refills | Status: DC

## 2022-12-23 NOTE — Progress Notes (Signed)
Subjective:    Patient ID: Donald [name], male    DOB: Sep 20, 1985, 37 y.o.   MRN: [id number]  HPI The patient comes in today for a wellness visit.  History of Present Illness   The patient, currently on a regimen of semaglutide [facility]) for weight management, reports good tolerance to the medication with no noticeable side effects. He is currently on the third week of a 0.25mg  dose, which has not yet resulted in a significant decrease in appetite. The patient has no difficulty obtaining the medication and reports no concerns or questions about it.  The patient's stress levels are reported as reasonable, and his mood is improving. Despite a busy schedule, he manages to incorporate physical activity into his daily routine through constant walking at work. However, he reports feeling tired after work and does not engage in additional exercise on his days off.  In terms of dietary habits, the patient is making an effort to consume healthier foods, focusing on vegetables, fruits, lean meats, and minimizing processed foods. His liquid intake primarily consists of water and a moderate amount of diet drinks.  The patient reports adequate sleep and normal bowel and urinary functions. However, he experiences persistent knee pain, which he attributes to arthritis. He manages the discomfort without medication.  The patient has noticed some weight loss since starting the semaglutide, which has improved his overall well-being. He is hopeful that further weight loss will alleviate the discomfort in his knees. The patient is committed to the medication regimen and plans to increase the dose every four weeks as tolerated.  The patient's cholesterol levels were found to be moderately elevated in recent lab results. He is open to the possibility of medication if the levels do not improve with weight loss over the next five to six months. The patient's blood pressure, previously borderline, has improved with  weight loss. He has no particular worries or concerns about his wellness at this time.       A review of their health history was completed.  A review of medications was also completed.  Any needed refills; semaglutide  Eating habits: good  Falls/  MVA accidents in past few months: no  Regular exercise:   Specialist pt sees on regular basis: none  Preventative health issues were discussed.   Additional concerns: no    Review of Systems     Objective:   Physical Exam General-in no acute distress Eyes-no discharge Lungs-respiratory rate normal, CTA CV-no murmurs,RRR Extremities skin warm dry no edema Neuro grossly normal Behavior normal, alert HEENT benign Abdomen soft no masses       Assessment & Plan:  1. Well adult exam Adult wellness-complete.wellness physical was conducted today. Importance of diet and exercise were discussed in detail.  Importance of stress reduction and healthy living were discussed.  In addition to this a discussion regarding safety was also covered.  We also reviewed over immunizations and gave recommendations regarding current immunization needed for age.   In addition to this additional areas were also touched on including: Preventative health exams needed:  Colonoscopy not indicated  Patient was advised yearly wellness exam   2. Immunization due Today - Flu vaccine trivalent PF, 6mos and older(Flulaval,Afluria,Fluarix,Fluzone)  3. Morbid obesity (HCC) Wegovy would be of help Gradually increase the dose as tolerated every 4 weeks Patient to keep giving [place] feedback  4. Hyperlipidemia, unspecified hyperlipidemia type Healthy diet regular activity recommended recheck lab work 6 months  5. Stress Continue sertraline  Assessment and Plan    Weight Management Currently on semaglutide 0.25mg  with good tolerance but no significant appetite suppression. Plan to increase dose every 4 weeks as tolerated. -Increase semaglutide  to 0.5mg  after 4 weeks. -Patient to provide updates on tolerance and weight changes every 3-4 weeks via MyChart.  Hyperlipidemia Moderately elevated LDL cholesterol. Discussed potential need for medication if levels do not improve with weight loss. -Repeat lipid panel in 5-6 months. -Consider statin therapy if LDL remains elevated.  Knee Arthritis Reports knee pain, likely exacerbated by current weight. No current analgesic use. -Encouraged weight loss to potentially alleviate knee pain.  General Health Maintenance -Continue sertraline at current dose. -Encouraged regular physical activity and healthy diet. -Flu vaccination administered. -Follow-up in 5-6 months.

## 2022-12-30 ENCOUNTER — Other Ambulatory Visit: Payer: Self-pay | Admitting: Family Medicine

## 2023-01-15 ENCOUNTER — Encounter: Payer: Self-pay | Admitting: Family Medicine

## 2023-01-20 ENCOUNTER — Encounter: Payer: Self-pay | Admitting: Family Medicine

## 2023-01-20 ENCOUNTER — Telehealth: Payer: 59 | Admitting: Family Medicine

## 2023-01-20 DIAGNOSIS — J019 Acute sinusitis, unspecified: Secondary | ICD-10-CM

## 2023-01-20 DIAGNOSIS — B349 Viral infection, unspecified: Secondary | ICD-10-CM | POA: Diagnosis not present

## 2023-01-20 MED ORDER — AMOXICILLIN-POT CLAVULANATE 875-125 MG PO TABS: 1.0000 | ORAL_TABLET | Freq: Two times a day (BID) | ORAL | 0 refills | Status: DC

## 2023-01-20 NOTE — Progress Notes (Signed)
   Subjective:    Patient ID: Donald [name], male    DOB: 1985-05-27, 38 y.o.   MRN: [id number]  HPI Patient with approximately 5 days of head congestion drainage coughing and at 2 to 2-1/2 days of bodyaches and not feeling good he denies wheezing difficulty breathing denies high fever states energy level has been relatively reasonable up until all of this hit him unable to work Patient does complain of right ear pain does complain of some sinus pressure pain and discomfort  Virtual Visit via Video Note  I connected with Donald [name] on 01/20/23 at 10:40 AM EST by a video enabled telemedicine application and verified that I am speaking with the correct person using two identifiers.  Location: Patient: Home Provider: Office   I discussed the limitations of evaluation and management by telemedicine and the availability of in person appointments. The patient expressed understanding and agreed to proceed.  History of Present Illness:    Observations/Objective:   Assessment and Plan:   Follow Up Instructions:    I discussed the assessment and treatment plan with the patient. The patient was provided an opportunity to ask questions and all were answered. The patient agreed with the plan and demonstrated an understanding of the instructions.   The patient was advised to call back or seek an in-person evaluation if the symptoms worsen or if the condition fails to improve as anticipated.  I provided 15 minutes of non-face-to-face time during this encounter.   Glendia Fielding, MD   Review of Systems     Objective:   Physical Exam Patient had virtual visit-video Appears to be in no distress Atraumatic Neuro able to relate and oriented No apparent resp distress Color normal       Assessment & Plan:   Viral syndrome Possible flu Outside the window that Tamiflu  would help Recommend doing home COVID test Report to us  if positive Let us  know if worsening over the next  48 hours we will work him into be seen Sinusitis antibiotic prescribed for next 7 days follow-up if progressive troubles or worse

## 2023-03-17 ENCOUNTER — Encounter: Payer: Self-pay | Admitting: Family Medicine

## 2023-03-18 ENCOUNTER — Telehealth: Payer: Self-pay

## 2023-03-18 NOTE — Telephone Encounter (Signed)
 Patient dropped off document  [employer] Medication Form  , to be filled out by provider. Patient requested to send it back via Fax within 7-days. Document is located in providers tray at front office.Please advise at Mobile [phone] (mobile)

## 2023-03-20 ENCOUNTER — Other Ambulatory Visit: Payer: Self-pay | Admitting: Family Medicine

## 2023-03-20 NOTE — Telephone Encounter (Signed)
 Nurses please connect with patient-he is on Zoloft currently. Several years ago he was prescribed Xanax.  Please find out for the patient does he still use Xanax? (If he does use it it should only be when at home not with driving.  If he is not using it please note this) please send message back to me

## 2023-03-22 ENCOUNTER — Telehealth: Payer: Self-pay | Admitting: Family Medicine

## 2023-03-22 ENCOUNTER — Other Ambulatory Visit: Payer: Self-pay | Admitting: Family Medicine

## 2023-03-22 NOTE — Telephone Encounter (Signed)
 Patient did drop off DMV form I completed I believe 95% of it So I believe I did fill out that form if may need me to sign off on it in regards to no longer taking the alprazolam If I need to fill out additional aspects on that form and send it back to me otherwise patient should be able to pick it up thanks

## 2023-03-22 NOTE — Telephone Encounter (Signed)
 Patient is checking on form that was brought in on 3/6 and sent back in red folder .

## 2023-03-22 NOTE — Telephone Encounter (Signed)
 Patient stated he no longer takes the xanax just Zoloft 100mg 

## 2023-03-22 NOTE — Telephone Encounter (Signed)
 See other message from Dr Lorin Picket concerning this

## 2023-03-23 DIAGNOSIS — Z0279 Encounter for issue of other medical certificate: Secondary | ICD-10-CM

## 2023-03-25 NOTE — Telephone Encounter (Signed)
Form was completed and given to [facility]

## 2023-06-18 ENCOUNTER — Other Ambulatory Visit: Payer: Self-pay | Admitting: Family Medicine

## 2023-06-30 ENCOUNTER — Ambulatory Visit: Payer: 59 | Admitting: Family Medicine

## 2023-06-30 ENCOUNTER — Encounter: Payer: Self-pay | Admitting: Family Medicine

## 2023-06-30 DIAGNOSIS — R739 Hyperglycemia, unspecified: Secondary | ICD-10-CM | POA: Diagnosis not present

## 2023-06-30 DIAGNOSIS — E785 Hyperlipidemia, unspecified: Secondary | ICD-10-CM | POA: Diagnosis not present

## 2023-06-30 NOTE — Progress Notes (Signed)
 Subjective:    Patient ID: Donald [name], male    DOB: Feb 17, 1985, 38 y.o.   MRN: [id number]  HPI  Discussed the use of AI scribe software for clinical note transcription with the patient, who gave verbal consent to proceed.  History of Present Illness   Donald [name] is a 38 year old male who presents with concerns about weight management and medication side effects.  He is experiencing challenges with weight management, particularly after his insurance changed to [employer] on July 1st, affecting coverage for certain medications. He previously used Wegovy  for weight loss, but insurance coverage issues led to discontinuation, resulting in weight regain. He is considering weight loss surgery due to the impact of his weight on his knees and heels, especially during work, which involves significant physical activity.  He discontinued sertraline  due to stomach upset after increasing the dose to one and a half tablets. He is currently not taking any sertraline  and feels better off the medication.  He is actively trying to manage his weight through diet and physical activity. 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 He also remains active on weekends with his children.  His mother had weight loss surgery, which he is considering as a potential option for himself. He has experienced knee and heel pain, particularly when working, and has consulted a specialist who suggested an MRI, which he did not pursue.  His blood pressure readings have been higher when his weight is up. He is also due for cholesterol and sugar level checks. No current health concerns outside of weight management and related issues.       Review of Systems     Objective:   Physical Exam  General-in no acute distress Eyes-no discharge Lungs-respiratory rate normal, CTA CV-no murmurs,RRR Extremities skin warm dry no  edema Neuro grossly normal Behavior normal, alert       Assessment & Plan:  Assessment and Plan    Obesity Significant weight-related issues, including knee and heel pain, likely due to obesity. Discussed medications like semaglutide  and phentermine, noting long-term commitment and side effects. Weight loss surgery could offer more permanent reduction. - Check insurance coverage for weight loss medications and surgery. - Consider weight loss surgery options if covered by insurance. - Order blood work to assess current health status.  Hypertension Blood pressure readings indicate borderline hypertension. Weight loss could improve control. - Monitor blood pressure regularly. - Encourage weight loss to improve blood pressure control.  General Health Maintenance Emphasized regular monitoring of cholesterol and blood sugar levels. - Order cholesterol and blood sugar tests. - Encourage healthy dietary choices and regular physical activity.      1. Morbid obesity (HCC) (Primary) Portion control regular physical activity follow-up if any ongoing troubles - Basic Metabolic Panel  2. Hyperlipidemia, unspecified hyperlipidemia type Check lab work may or may not need medication - Lipid Panel  3. Hyperglycemia Check A1c patient at risk of diabetes due to morbid obesity and hyperglycemia - Hemoglobin A1c  We did discuss medications including Zepbound as well as surgery like gastric sleeve gastric bypass he will check in with his insurance and he will give us  feedback regarding this then we can move forward with either ordering medication or lab work  He has tried numerous times throughout the years restricted calorie regular activity increased activity and has not been successful with losing weight  He did have good success in losing weight with Wegovy  but his insurance stopped covering it

## 2023-07-01 ENCOUNTER — Ambulatory Visit: Payer: Self-pay | Admitting: Family Medicine

## 2023-07-01 LAB — BASIC METABOLIC PANEL WITH GFR
BUN/Creatinine Ratio: 16 (ref 9–20)
BUN: 15 mg/dL (ref 6–20)
CO2: 20 mmol/L (ref 20–29)
Calcium: 9.9 mg/dL (ref 8.7–10.2)
Chloride: 102 mmol/L (ref 96–106)
Creatinine, Ser: 0.94 mg/dL (ref 0.76–1.27)
Glucose: 108 mg/dL — ABNORMAL HIGH (ref 70–99)
Potassium: 4.8 mmol/L (ref 3.5–5.2)
Sodium: 140 mmol/L (ref 134–144)
eGFR: 106 mL/min/{1.73_m2} (ref >59)

## 2023-07-01 LAB — LIPID PANEL
Chol/HDL Ratio: 4.2 ratio (ref 0.0–5.0)
Cholesterol, Total: 191 mg/dL (ref 100–199)
HDL: 45 mg/dL (ref >39)
LDL Chol Calc (NIH): 123 mg/dL — ABNORMAL HIGH (ref 0–99)
Triglycerides: 131 mg/dL (ref 0–149)
VLDL Cholesterol Cal: 23 mg/dL (ref 5–40)

## 2023-07-01 LAB — HEMOGLOBIN A1C
Est. average glucose Bld gHb Est-mCnc: 123 mg/dL
Hgb A1c MFr Bld: 5.9 % — ABNORMAL HIGH (ref 4.8–5.6)

## 2023-07-14 ENCOUNTER — Encounter: Payer: Self-pay | Admitting: Family Medicine

## 2023-07-14 ENCOUNTER — Other Ambulatory Visit: Payer: Self-pay

## 2023-07-14 NOTE — Telephone Encounter (Signed)
 Nurses-please go ahead with bariatric surgery consultation [facility] in [place] for [place] diagnosis morbid obesity Please send him notification this has been initiated their office should reach out to him within the next 2 to 3 weeks to help schedule the next step thank you  If he has not heard anything over the next 2 to 3 weeks let us  know thanks-Dr. Glendia

## 2023-08-19 ENCOUNTER — Encounter: Payer: Self-pay | Admitting: Family Medicine

## 2023-08-19 ENCOUNTER — Telehealth: Payer: Self-pay

## 2023-08-19 NOTE — Progress Notes (Signed)
 Please print this letter to go with the form that I filled out thank you

## 2023-08-19 NOTE — Telephone Encounter (Signed)
 Patient dropped off document [facility] form , to be filled out by provider. Patient requested to send it back via Call Patient to pick up within ASAP. Document is located in providers tray at front office.Please advise at Mobile [phone] (mobile)   Placed in yellow folder up front 08/07

## 2023-09-09 ENCOUNTER — Encounter: Payer: Self-pay | Admitting: Family Medicine

## 2023-09-10 ENCOUNTER — Telehealth: Payer: Self-pay | Admitting: Family Medicine

## 2023-09-10 NOTE — Telephone Encounter (Signed)
 Hi Staff  This form was dropped off several weeks ago I have filled in portions from myself I forwarded this form out to my office Patient has sent a MyChart message stating that he has not seen the form  Please see MyChart message from the patient   my request #1 please see what you can do to locate the form either in the nurses area or the front office area  #2 if the form is located and it is completed please call the patient, so he can come pick up the form and make sure a copy of this is scanned into the system  #3 if no form is found-let me know, let the patient know, let the patient know that the nurses will call [facility] and asked them to refax a form ASAP so that it can be filled out  #4 if the form is found and it needs something additional for me please make sure it is placed into a red folder and notification is given to me regarding this issue  Thank you-Dr. Glendia

## 2023-09-24 NOTE — Telephone Encounter (Signed)
 [facility] sent over second form to be complete on 09/24/23. Autumn completed the five years of weights on form. Form is in red folder in your box on the wall.

## 2023-09-28 NOTE — Telephone Encounter (Signed)
 Form was filled and then forwarded and signed off papers on Friday evening

## 2023-10-20 ENCOUNTER — Emergency Department (HOSPITAL_COMMUNITY)

## 2023-10-20 ENCOUNTER — Emergency Department (HOSPITAL_COMMUNITY)
Admission: EM | Admit: 2023-10-20 | Discharge: 2023-10-21 | Disposition: A | Discharge status: Home / Self Care | Attending: Emergency Medicine | Admitting: Emergency Medicine

## 2023-10-20 ENCOUNTER — Encounter (HOSPITAL_COMMUNITY): Payer: Self-pay

## 2023-10-20 ENCOUNTER — Other Ambulatory Visit: Payer: Self-pay

## 2023-10-20 ENCOUNTER — Other Ambulatory Visit (HOSPITAL_COMMUNITY)
Admission: RE | Admit: 2023-10-20 | Discharge: 2023-10-20 | Disposition: A | Discharge status: Home / Self Care | Source: Ambulatory Visit | Attending: Family Medicine | Admitting: Family Medicine

## 2023-10-20 ENCOUNTER — Encounter: Payer: Self-pay | Admitting: Family Medicine

## 2023-10-20 ENCOUNTER — Ambulatory Visit: Admitting: Family Medicine

## 2023-10-20 VITALS — BP 138/90 | HR 76 | Temp 98.6°F | Ht 74.0 in | Wt 353.0 lb

## 2023-10-20 DIAGNOSIS — I10 Essential (primary) hypertension: Secondary | ICD-10-CM | POA: Insufficient documentation

## 2023-10-20 DIAGNOSIS — R030 Elevated blood-pressure reading, without diagnosis of hypertension: Secondary | ICD-10-CM

## 2023-10-20 DIAGNOSIS — R079 Chest pain, unspecified: Secondary | ICD-10-CM

## 2023-10-20 DIAGNOSIS — D72829 Elevated white blood cell count, unspecified: Secondary | ICD-10-CM | POA: Insufficient documentation

## 2023-10-20 DIAGNOSIS — E785 Hyperlipidemia, unspecified: Secondary | ICD-10-CM | POA: Diagnosis not present

## 2023-10-20 DIAGNOSIS — R0789 Other chest pain: Secondary | ICD-10-CM | POA: Diagnosis not present

## 2023-10-20 LAB — BASIC METABOLIC PANEL WITH GFR
Anion gap: 12 (ref 5–15)
BUN: 15 mg/dL (ref 6–20)
CO2: 26 mmol/L (ref 22–32)
Calcium: 9.7 mg/dL (ref 8.9–10.3)
Chloride: 103 mmol/L (ref 98–111)
Creatinine, Ser: 1.04 mg/dL (ref 0.61–1.24)
GFR, Estimated: >60 mL/min (ref >60)
Glucose, Bld: 107 mg/dL — ABNORMAL HIGH (ref 70–99)
Potassium: 4 mmol/L (ref 3.5–5.1)
Sodium: 141 mmol/L (ref 135–145)

## 2023-10-20 LAB — CBC
HCT: 46.2 % (ref 39.0–52.0)
Hemoglobin: 15.1 g/dL (ref 13.0–17.0)
MCH: 27.4 pg (ref 26.0–34.0)
MCHC: 32.7 g/dL (ref 30.0–36.0)
MCV: 83.8 fL (ref 80.0–100.0)
Platelets: 357 K/uL (ref 150–400)
RBC: 5.51 MIL/uL (ref 4.22–5.81)
RDW: 12.6 % (ref 11.5–15.5)
WBC: 12.9 K/uL — ABNORMAL HIGH (ref 4.0–10.5)
nRBC: 0 % (ref 0.0–0.2)

## 2023-10-20 LAB — TROPONIN T, HIGH SENSITIVITY
Troponin T High Sensitivity: <15 ng/L (ref 0–19)
Troponin T High Sensitivity: <15 ng/L (ref 0–19)

## 2023-10-20 MED ORDER — PANTOPRAZOLE SODIUM 40 MG PO TBEC: 40.0000 mg | DELAYED_RELEASE_TABLET | Freq: Every day | ORAL | 3 refills | Status: DC

## 2023-10-20 NOTE — Telephone Encounter (Signed)
 Appointment scheduled today with Dr Glendia

## 2023-10-20 NOTE — ED Triage Notes (Signed)
 Pt reports chest pain since yesterday, normal EKG and negative troponin in the office today but PCP wanted him to come to ER since he was still having them.

## 2023-10-20 NOTE — Progress Notes (Signed)
   Subjective:    Patient ID: Donald [name], male    DOB: [birth year], 38 y.o.   MRN: [id number]  HPI  Elevated BP reading at home , headache, chest pain 2 days  Patient relates her blood pressure has been elevated In addition to this he has noticed some chest pain intermittently over the past couple days with minimal activity The pain was bothersome to the point that he stopped work.  He does have a family history of coronary artery disease but none of this premature Does have risk factors including blood pressure morbid obesity and mild hyperlipidemia Review of Systems     Objective:   Physical Exam  Lungs are clear hearts regular pulse normal morbid obesity noted      Assessment & Plan:  1. Chest pain, unspecified type (Primary) He was able to do his strenuous work last week without having any angina symptoms.  His chest pain really does not sound like angina is more of a chest pain and it is possible it could be reflux.  Troponin came back negative.  EKG showed some changes but no acute changes I would recommend pursuing forward with cardiology consult and I think he would benefit from having a CTA of the coronary arteries to rule out the possibility of significant coronary artery buildup.  He does have strong risk factors - EKG 12-Lead - Troponin I  2. Morbid obesity (HCC) Portion control regular physical activity  3. Hyperlipidemia, unspecified hyperlipidemia type If his test comes back showing significant coronary artery disease he will need to be on aggressive measures to lower LDL  4. Elevated blood pressure reading Healthy diet regular activity such as walking hold off on any strenuous activity including work this week hopefully can get the CTA in the relatively soon time  Addendum-his troponin came back negative.  When I spoke with him this evening he is still having substernal chest pain that is causing him to ache in his chest.  He does have risk factors for heart  disease.  I feel uneasy about having him stay at home and doing this workup as an outpatient.  I believe he should be seen in the ER for further evaluation.  If they determined that he is not having acute coronary syndrome then they can let him go and we will do further workup as an outpatient.  Patient states he will proceed to [facility]  CTA coronary placed

## 2023-10-21 ENCOUNTER — Encounter: Payer: Self-pay | Admitting: Family Medicine

## 2023-10-21 ENCOUNTER — Other Ambulatory Visit: Payer: Self-pay

## 2023-10-21 ENCOUNTER — Other Ambulatory Visit: Payer: Self-pay | Admitting: Family Medicine

## 2023-10-21 ENCOUNTER — Telehealth: Payer: Self-pay | Admitting: Family Medicine

## 2023-10-21 ENCOUNTER — Ambulatory Visit: Payer: Self-pay | Admitting: Family Medicine

## 2023-10-21 DIAGNOSIS — R079 Chest pain, unspecified: Secondary | ICD-10-CM

## 2023-10-21 DIAGNOSIS — R0789 Other chest pain: Secondary | ICD-10-CM

## 2023-10-21 MED ORDER — SUCRALFATE 1 G PO TABS: ORAL_TABLET | ORAL | 0 refills | Status: DC

## 2023-10-21 NOTE — ED Provider Notes (Signed)
 [name] EMERGENCY DEPARTMENT AT [facility]  Provider Note  CSN: [id number] Arrival date & time: 10/20/23 2148  History Chief Complaint  Patient presents with   Chest Pain    Donald [name] is a 38 y.o. male with history of HTN, HLD and family history of heart disease reports intermittent chest pains since yesterday, described as a dull ache, sometimes radiates into both arms. No significant SOB, diaphoresis. He was seen at PCP office today and had negative workup including Trop. Advised to come to the ED later in the evening for continued intermittent symptoms. He has not had any exertional component to his pain.    Home Medications Prior to Admission medications   Medication Sig Start Date End Date Taking? Authorizing Provider  augmented betamethasone dipropionate (DIPROLENE-AF) 0.05 % cream Apply 1 application topically 2 (two) times daily as needed (for psoriasis).  02/10/18   [provider]  pantoprazole  (PROTONIX ) 40 MG tablet Take 1 tablet (40 mg total) by mouth daily. 10/20/23   Alphonsa Glendia LABOR, MD  sildenafil  (VIAGRA ) 100 MG tablet TAKE 1/2 TO ONE TABLET BY MOUTH EVERY DAY AS NEEDED 06/21/23   Alphonsa Glendia LABOR, MD     Allergies    Patient has no known allergies.   Review of Systems   Review of Systems Please see HPI for pertinent positives and negatives  Physical Exam BP (!) 143/90 (BP Location: Right Arm)   Pulse 69   Temp 98.3 F (36.8 C) (Oral)   Resp 16   Wt (!) 160.1 kg   SpO2 98%   BMI 45.32 kg/m   Physical Exam Vitals and nursing note reviewed.  Constitutional:      Appearance: Normal appearance.  HENT:     Head: Normocephalic and atraumatic.     Nose: Nose normal.     Mouth/Throat:     Mouth: Mucous membranes are moist.  Eyes:     Extraocular Movements: Extraocular movements intact.     Conjunctiva/sclera: Conjunctivae normal.  Cardiovascular:     Rate and Rhythm: Normal rate.  Pulmonary:     Effort: Pulmonary effort is  normal.     Breath sounds: Normal breath sounds.  Abdominal:     General: Abdomen is flat.     Palpations: Abdomen is soft.     Tenderness: There is no abdominal tenderness.  Musculoskeletal:        General: No swelling. Normal range of motion.     Cervical back: Neck supple.  Skin:    General: Skin is warm and dry.  Neurological:     General: No focal deficit present.     Mental Status: He is alert.  Psychiatric:        Mood and Affect: Mood normal.     ED Results / Procedures / Treatments   EKG EKG Interpretation Date/Time:  Wednesday October 20 2023 22:22:03 EDT Ventricular Rate:  71 PR Interval:  220 QRS Duration:  96 QT Interval:  374 QTC Calculation: 406 R Axis:   79  Text Interpretation: Sinus rhythm with 1st degree A-V block Cannot rule out Anterior infarct , age undetermined Abnormal ECG When compared with ECG of 11-Sep-2018 18:11, Left posterior fascicular block is no longer Present No significant change since last tracing Confirmed by Roselyn Dunnings [phone]) on 10/21/2023 12:01:08 AM  Procedures Procedures  Medications Ordered in the ED Medications - No data to display  Initial Impression and Plan  Patient here for atypical chest pains, no exertional  component, EKG without ischemic changes. Labs done here show CBC with mild leukocytosis, BMP is normal. Trop remains neg. I personally viewed the images from radiology studies and agree with radiologist interpretation: CXR is clear.  HEART Score is 2, low risk for MACE. His PCP indicated he will order additional testing including Coronary CT as an outpatient. Recommend PCP follow up, RTED for any other concerns.     ED Course       MDM Rules/Calculators/A&P Medical Decision Making Given presenting complaint, I considered that admission might be necessary. After review of results from ED lab and/or imaging studies, admission to the hospital is not indicated at this time.    Problems Addressed: Nonspecific  chest pain: acute illness or injury  Amount and/or Complexity of Data Reviewed Labs: ordered. Decision-making details documented in ED Course. Radiology: ordered and independent interpretation performed. Decision-making details documented in ED Course. ECG/medicine tests: ordered and independent interpretation performed. Decision-making details documented in ED Course.  Risk Decision regarding hospitalization.     Final Clinical Impression(s) / ED Diagnoses Final diagnoses:  Nonspecific chest pain    Rx / DC Orders ED Discharge Orders          Ordered    Ambulatory referral to Cardiology       Comments: If you have not heard from the Cardiology office within the next 72 hours please call [phone].   10/21/23 0013             Roselyn Carlin NOVAK, MD 10/21/23 [phone]

## 2023-10-21 NOTE — Telephone Encounter (Signed)
 Patient is requesting work excuse from 10/20/2023-10/31/2023 returning to work on 11/01/23. Please advise

## 2023-10-21 NOTE — Telephone Encounter (Signed)
 Nurses Please go and cardiology consult-urgent I also ordered the CTA  Please send Charmaine a message letting her know that this has been ordered there is documentation There is a possibility that this will not get approved but at least were trying As for cardiology urgent consult would be fine for chest pain thank you  Notify patient that we are trying both of these measures Front May give him a work note for this week and next week  I also sent in Carafate  crush tablet mix with water  take 3 times daily for 1 week

## 2023-10-21 NOTE — Telephone Encounter (Signed)
 Work excuse has been done and pick in Health visitor for patient/

## 2023-10-21 NOTE — Telephone Encounter (Signed)
 Urgent cardiology referral has been placed, message sent to [facility] referral/ test authorization staff regarding CTA ordered.

## 2023-10-21 NOTE — Telephone Encounter (Signed)
 Thank you :)

## 2023-10-22 ENCOUNTER — Telehealth: Payer: Self-pay | Admitting: Family Medicine

## 2023-10-22 NOTE — Telephone Encounter (Signed)
 Patient dropped off FMLA to be completed in your red folder on your table.

## 2023-10-26 ENCOUNTER — Encounter: Payer: Self-pay | Admitting: Family Medicine

## 2023-10-26 NOTE — Telephone Encounter (Signed)
 This has been completed as requested Please scan a copy into the chart Please have the patient come pick this up he can look it over if there needs to be anything else let us  know

## 2023-10-26 NOTE — Telephone Encounter (Signed)
 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 Glendia

## 2023-10-27 ENCOUNTER — Encounter: Payer: Self-pay | Admitting: Family Medicine

## 2023-11-01 ENCOUNTER — Ambulatory Visit: Payer: Self-pay | Admitting: Physician Assistant

## 2023-11-01 ENCOUNTER — Encounter: Payer: Self-pay | Admitting: Physician Assistant

## 2023-11-01 ENCOUNTER — Ambulatory Visit: Attending: Physician Assistant | Admitting: Physician Assistant

## 2023-11-01 VITALS — BP 140/72 | HR 88 | Ht 74.0 in | Wt 350.0 lb

## 2023-11-01 DIAGNOSIS — R030 Elevated blood-pressure reading, without diagnosis of hypertension: Secondary | ICD-10-CM | POA: Diagnosis not present

## 2023-11-01 DIAGNOSIS — G4733 Obstructive sleep apnea (adult) (pediatric): Secondary | ICD-10-CM

## 2023-11-01 DIAGNOSIS — R0602 Shortness of breath: Secondary | ICD-10-CM

## 2023-11-01 DIAGNOSIS — R079 Chest pain, unspecified: Secondary | ICD-10-CM

## 2023-11-01 DIAGNOSIS — R072 Precordial pain: Secondary | ICD-10-CM

## 2023-11-01 LAB — D-DIMER, QUANTITATIVE: D-DIMER: 0.2 mg{FEU}/L (ref 0.00–0.49)

## 2023-11-01 MED ORDER — METOPROLOL TARTRATE 100 MG PO TABS: ORAL_TABLET | ORAL | 0 refills | Status: DC

## 2023-11-01 NOTE — Telephone Encounter (Signed)
 Patient returned PA's call regarding results.

## 2023-11-01 NOTE — Progress Notes (Unsigned)
 Cardiology Office Note   Date:  11/03/2023  ID:  Donald, [name] 07-29-1985, MRN [id number] PCP: Alphonsa Glendia LABOR, MD  [facility] Providers Cardiologist:  [facility]     History of Present Illness Donald [name] is a 38 y.o. male with past medical history of morbid obesity, elevated blood pressure, prediabetes and hyperlipidemia.  He was recently seen by PCP on 10/20/2023 for chest pain.  He went to the ED that same night due to worsening chest discomfort.  Troponin was negative.  White blood cell count elevated at 12.9.  Normal renal function and electrolyte.  Chest x-ray showed no acute finding.  Patient presents today for evaluation of chest discomfort in the [facility].  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  Chest discomfort has been intermittent for the past 2 weeks and is substernal in location.  It sometimes radiate to the shoulder and arm.  He also has intermittent shortness of breath as well.  He has no lower extremity edema on exam.  His father was diagnosed with heart failure and underwent bypass surgery in his early 49s, therefore he is quite concerned about his family history.  I recommended echocardiogram and coronary CTA.  He will need 100 mg of metoprolol tartrate in the morning of the coronary CTA to slow down the heart rate.  He is blood pressure is borderline elevated, I recommend that he reestablish with Dr. [name] of neurology service.  He has a history of mild obstructive sleep apnea that was diagnosed on a home sleep study in 2021, I do not think the patient ever underwent CPAP titration.  He is wife has previously mentioned to him that he snores during sleep.  Once his obstructive sleep apnea is under better control, I suspect his daytime somnolence will improve and he will get more energy.  He has blood pressure should also improve as well.  I will also  obtain a D-dimer to make sure he does not have any blood clot.  I plan to see the patient back in 6 weeks.   ROS:   Patient complains of chest discomfort and shortness of breath.  He has no lower extremity edema, apnea or PND.  Studies Reviewed          Risk Assessment/Calculations          Physical Exam VS:  BP (!) 140/72 (BP Location: Right Arm, Patient Position: Sitting, Cuff Size: Large)   Pulse 88   Ht 6' 2 (1.88 m)   Wt (!) 350 lb (158.8 kg)   SpO2 98%   BMI 44.94 kg/m        Wt Readings from Last 3 Encounters:  11/01/23 (!) 350 lb (158.8 kg)  10/20/23 (!) 353 lb (160.1 kg)  10/20/23 (!) 353 lb (160.1 kg)    GEN: Well nourished, well developed in no acute distress NECK: No JVD; No carotid bruits CARDIAC: RRR, no murmurs, rubs, gallops RESPIRATORY:  Clear to auscultation without rales, wheezing or rhonchi  ABDOMEN: Soft, non-tender, non-distended EXTREMITIES:  No edema; No deformity   ASSESSMENT AND PLAN  Chest discomfort: Recommend coronary CTA.  He will need a single tablet of 100 mg metoprolol tartrate 2 hours prior to the coronary CTA  Intermittent shortness of breath: Obtain D-dimer and echocardiogram  Elevated blood pressure: He has no formal diagnosis of hypertension.  I suspect elevated  blood pressure is due to untreated obstructive sleep apnea.  OSA: He had a prior history of mild OSA.  He never underwent CPAP titration.  He will need to follow-up with Dr. [name] of neurology service was managing his sleep apnea.       Dispo: Follow-up in 6 weeks  Signed, Avrom Robarts, PA

## 2023-11-01 NOTE — Progress Notes (Signed)
 D-dimer negative, which argues against any blood clot. This is reassuring.

## 2023-11-01 NOTE — Patient Instructions (Addendum)
 Medication Instructions:  Metoprolol Tartrate 100 mg-take 1 tablet 2 hours prior to cardiac ct.  Your physician recommends that you continue on your current medications as directed. Please refer to the Current Medication list given to you today.  *If you need a refill on your cardiac medications before your next appointment, please call your pharmacy*  Lab Work: D-Dimer   Testing/Procedures: Your physician has requested that you have an echocardiogram. Echocardiography is a painless test that uses sound waves to create images of your heart. It provides your doctor with information about the size and shape of your heart and how well your heart's chambers and valves are working. This procedure takes approximately one hour. There are no restrictions for this procedure. Please do NOT wear cologne, perfume, aftershave, or lotions (deodorant is allowed). Please arrive 15 minutes prior to your appointment time.  Please note: We ask at that you not bring children with you during ultrasound (echo/ vascular) testing. Due to room size and safety concerns, children are not allowed in the ultrasound rooms during exams. Our front office staff cannot provide observation of children in our lobby area while testing is being conducted. An adult accompanying a patient to their appointment will only be allowed in the ultrasound room at the discretion of the ultrasound technician under special circumstances. We apologize for any inconvenience.    Your cardiac CT will be scheduled at one of the below locations:   [facility] [address]  [place], [place] [address]  Proceed to the [facility] Radiology Department (first floor) to check-in and test prep.  Please follow these instructions carefully (unless otherwise directed):  An IV will be required for this test and Nitroglycerin will be given.  Hold all erectile dysfunction medications at least 3 days (72 hrs) prior to test. (Ie  viagra , cialis, sildenafil , tadalafil, etc)   On the Night Before the Test: Be sure to Drink plenty of water . Do not consume any caffeinated/decaffeinated beverages or chocolate 12 hours prior to your test. Do not take any antihistamines 12 hours prior to your test.   On the Day of the Test: Drink plenty of water  until 1 hour prior to the test. Do not eat any food 1 hour prior to test. You may take your regular medications prior to the test.  Take metoprolol (Lopressor) two hours prior to test. If you take Furosemide/Hydrochlorothiazide/Spironolactone/Chlorthalidone, please HOLD on the morning of the test. Patients who wear a continuous glucose monitor MUST remove the device prior to scanning. FEMALES- please wear underwire-free bra if available, avoid dresses & tight clothing      After the Test: Drink plenty of water . After receiving IV contrast, you may experience a mild flushed feeling. This is normal. On occasion, you may experience a mild rash up to 24 hours after the test. This is not dangerous. If this occurs, you can take Benadryl  25 mg, Zyrtec, Claritin, or Allegra and increase your fluid intake. (Patients taking Tikosyn should avoid Benadryl , and may take Zyrtec, Claritin, or Allegra) If you experience trouble breathing, this can be serious. If it is severe call 911 IMMEDIATELY. If it is mild, please call our office.  We will call to schedule your test 2-4 weeks out understanding that some insurance companies will need an authorization prior to the service being performed.   For more information and frequently asked questions, please visit our website : [email or web address]  For non-scheduling related questions, please contact the cardiac imaging nurse navigator  should you have any questions/concerns: Cardiac Imaging Nurse Navigators Direct Office Dial: [phone]   For scheduling needs, including cancellations and rescheduling, please call [place],  [phone].    Follow-Up: At [facility], you and your health needs are our priority.  As part of our continuing mission to provide you with exceptional heart care, our providers are all part of one team.  This team includes your primary Cardiologist (physician) and Advanced Practice Providers or APPs (Physician Assistants and Nurse Practitioners) who all work together to provide you with the care you need, when you need it.  Your next appointment:   6 week(s)  Provider:   Scot Ford, PA-C          We recommend signing up for the patient portal called MyChart.  Sign up information is provided on this After Visit Summary.  MyChart is used to connect with patients for Virtual Visits (Telemedicine).  Patients are able to view lab/test results, encounter notes, upcoming appointments, etc.  Non-urgent messages can be sent to your provider as well.   To learn more about what you can do with MyChart, go to [email or web address].   Other Instructions Please follow up with your sleep doctor.

## 2023-11-09 ENCOUNTER — Encounter (HOSPITAL_COMMUNITY): Payer: Self-pay

## 2023-11-10 ENCOUNTER — Telehealth (HOSPITAL_BASED_OUTPATIENT_CLINIC_OR_DEPARTMENT_OTHER): Payer: Self-pay

## 2023-11-10 DIAGNOSIS — K449 Diaphragmatic hernia without obstruction or gangrene: Secondary | ICD-10-CM | POA: Diagnosis not present

## 2023-11-10 DIAGNOSIS — F411 Generalized anxiety disorder: Secondary | ICD-10-CM | POA: Diagnosis not present

## 2023-11-10 DIAGNOSIS — I440 Atrioventricular block, first degree: Secondary | ICD-10-CM | POA: Diagnosis not present

## 2023-11-10 NOTE — Telephone Encounter (Signed)
   Pre-operative Risk Assessment    Patient Name: Donald [name]  DOB: 1985/09/27 MRN: [id number]   Date of last office visit: 11/01/23 with Janene Date of next office visit: 12/14/23 with Janene   Request for Surgical Clearance    Procedure:  Bariatric Surgery  Date of Surgery:  Clearance TBD                                 Surgeon:  Dr. Deward Foy  Surgeon's Group or Practice Name:  [facility] Phone number:  [phone] Fax number:  [phone] attn: Jordan Hale   Type of Clearance Requested:   - Medical    Type of Anesthesia:  General    Additional requests/questions:    Bonney Augustin JONETTA Delores   11/10/2023, 1:42 PM

## 2023-11-11 ENCOUNTER — Ambulatory Visit (HOSPITAL_COMMUNITY)
Admission: RE | Admit: 2023-11-11 | Discharge: 2023-11-11 | Disposition: A | Discharge status: Home / Self Care | Source: Ambulatory Visit | Attending: Physician Assistant | Admitting: Physician Assistant

## 2023-11-11 DIAGNOSIS — J984 Other disorders of lung: Secondary | ICD-10-CM | POA: Diagnosis not present

## 2023-11-11 DIAGNOSIS — R072 Precordial pain: Secondary | ICD-10-CM | POA: Insufficient documentation

## 2023-11-11 MED ORDER — IOHEXOL 350 MG/ML SOLN
125.0000 mL | Freq: Once | INTRAVENOUS | Status: AC | PRN
Start: 2023-11-11 — End: 2023-11-11
  Administered 2023-11-11: 125 mL via INTRAVENOUS

## 2023-11-11 MED ORDER — NITROGLYCERIN 0.4 MG SL SUBL
0.8000 mg | SUBLINGUAL_TABLET | Freq: Once | SUBLINGUAL | Status: AC
  Administered 2023-11-11: 0.8 mg via SUBLINGUAL

## 2023-11-15 ENCOUNTER — Ambulatory Visit: Payer: Self-pay | Admitting: Surgery

## 2023-11-18 NOTE — Telephone Encounter (Signed)
 I will update all parties involved pt has appt 12/14/23 Scot Ford, PAC.

## 2023-11-18 NOTE — Telephone Encounter (Signed)
   Name: RENEE BEALE  DOB: Mar 06, 1985  MRN: [id number]  Primary Cardiologist: None  Chart reviewed as part of pre-operative protocol coverage. Because of Laderrick Wilk Schweigert's past medical history and time since last visit, he will require a follow-up in-office visit in order to better assess preoperative cardiovascular risk.  Patient has an office visit scheduled on 12/14/2023 with Hao Meng, PA-C. Appointment notes have been updated to reflect need for pre-op evaluation.   Pre-op covering staff:  - Please contact requesting surgeon's office via preferred method (i.e, phone, fax) to inform them of need for appointment prior to surgery.  Barnie Hila, NP  11/18/2023, 12:28 PM

## 2023-11-19 NOTE — Telephone Encounter (Signed)
 Coronary CT showed clean coronary arteries, pending extra cardiac portion of cor CT read by radiologist and echocardiogram near the end of this month, if both tests came back negative, patient is at acceptable risk to proceed from cardiac perspective.   Yehuda Printup

## 2023-11-19 NOTE — Telephone Encounter (Signed)
   Name: Donald [name]  DOB: 03-26-1985  MRN: [id number]   Primary Cardiologist: None  Chart reviewed as part of pre-operative protocol coverage. Donald [name] was last seen on 11/01/2023 by Scot Ford, PA-C.  He underwent coronary CTA for risk stratification. Per Scot, Coronary CT showed clean coronary arteries, pending extra cardiac portion of cor CT read by radiologist and echocardiogram near the end of this month, if both tests came back negative, patient is at acceptable risk to proceed from cardiac perspective.  Therefore, based on ACC/AHA guidelines, the patient would be an acceptable risk for the planned procedure without further cardiovascular testing.   I will route this recommendation to the requesting party via Epic fax function and remove from pre-op pool. Please call with questions.  Barnie Hila, NP 11/19/2023, 5:00 PM

## 2023-11-20 NOTE — Progress Notes (Signed)
 Radiologist over read of lung shows possible small airway disease, if breathing worsens, may need to see a pulmonologist.

## 2023-11-22 DIAGNOSIS — R6889 Other general symptoms and signs: Secondary | ICD-10-CM | POA: Diagnosis not present

## 2023-11-22 DIAGNOSIS — I440 Atrioventricular block, first degree: Secondary | ICD-10-CM | POA: Diagnosis not present

## 2023-11-22 DIAGNOSIS — Z79899 Other long term (current) drug therapy: Secondary | ICD-10-CM | POA: Diagnosis not present

## 2023-11-22 DIAGNOSIS — F411 Generalized anxiety disorder: Secondary | ICD-10-CM | POA: Diagnosis not present

## 2023-11-22 DIAGNOSIS — E785 Hyperlipidemia, unspecified: Secondary | ICD-10-CM | POA: Diagnosis not present

## 2023-11-25 ENCOUNTER — Encounter (HOSPITAL_COMMUNITY): Payer: Self-pay | Admitting: Surgery

## 2023-11-25 ENCOUNTER — Encounter: Payer: Self-pay | Admitting: Physician Assistant

## 2023-12-01 NOTE — Anesthesia Preprocedure Evaluation (Signed)
 Anesthesia Evaluation  Patient identified by MRN, date of birth, ID band Patient awake    Reviewed: Allergy & Precautions, NPO status , Patient's Chart, lab work & pertinent test results, reviewed documented beta blocker date and time   History of Anesthesia Complications Negative for: history of anesthetic complications  Airway Mallampati: II  TM Distance: >3 FB Neck ROM: Full    Dental  (+) Dental Advisory Given, Teeth Intact   Pulmonary sleep apnea    Pulmonary exam normal        Cardiovascular hypertension, Pt. on medications and Pt. on home beta blockers Normal cardiovascular exam     Neuro/Psych  PSYCHIATRIC DISORDERS Anxiety Depression    negative neurological ROS     GI/Hepatic Neg liver ROS,GERD  Controlled and Medicated,,  Endo/Other    Class 3 obesity  Renal/GU negative Renal ROS     Musculoskeletal negative musculoskeletal ROS (+)    Abdominal  (+) + obese  Peds  Hematology negative hematology ROS (+)   Anesthesia Other Findings   Reproductive/Obstetrics                              Anesthesia Physical Anesthesia Plan  ASA: 3  Anesthesia Plan: MAC   Post-op Pain Management: Minimal or no pain anticipated   Induction:   PONV Risk Score and Plan: 1 and Propofol  infusion and Treatment may vary due to age or medical condition  Airway Management Planned: Nasal Cannula and Natural Airway  Additional Equipment: None  Intra-op Plan:   Post-operative Plan:   Informed Consent: I have reviewed the patients History and Physical, chart, labs and discussed the procedure including the risks, benefits and alternatives for the proposed anesthesia with the patient or authorized representative who has indicated his/her understanding and acceptance.       Plan Discussed with: CRNA and Anesthesiologist  Anesthesia Plan Comments:          Anesthesia Quick Evaluation

## 2023-12-02 ENCOUNTER — Encounter (HOSPITAL_COMMUNITY): Payer: Self-pay | Admitting: Surgery

## 2023-12-02 ENCOUNTER — Encounter (HOSPITAL_COMMUNITY)
Admission: RE | Disposition: A | Discharge status: Home / Self Care | Payer: Self-pay | Source: Home / Self Care | Attending: Surgery

## 2023-12-02 ENCOUNTER — Other Ambulatory Visit: Payer: Self-pay

## 2023-12-02 ENCOUNTER — Ambulatory Visit (HOSPITAL_COMMUNITY)
Admission: RE | Admit: 2023-12-02 | Discharge: 2023-12-02 | Disposition: A | Discharge status: Home / Self Care | Attending: Surgery | Admitting: Surgery

## 2023-12-02 ENCOUNTER — Encounter (HOSPITAL_COMMUNITY): Payer: Self-pay | Admitting: Registered Nurse

## 2023-12-02 ENCOUNTER — Ambulatory Visit (HOSPITAL_COMMUNITY): Payer: Self-pay | Admitting: Registered Nurse

## 2023-12-02 DIAGNOSIS — K219 Gastro-esophageal reflux disease without esophagitis: Secondary | ICD-10-CM | POA: Insufficient documentation

## 2023-12-02 DIAGNOSIS — K317 Polyp of stomach and duodenum: Secondary | ICD-10-CM | POA: Diagnosis not present

## 2023-12-02 DIAGNOSIS — Z01818 Encounter for other preprocedural examination: Secondary | ICD-10-CM | POA: Diagnosis not present

## 2023-12-02 DIAGNOSIS — Z79899 Other long term (current) drug therapy: Secondary | ICD-10-CM | POA: Diagnosis not present

## 2023-12-02 DIAGNOSIS — K3189 Other diseases of stomach and duodenum: Secondary | ICD-10-CM | POA: Insufficient documentation

## 2023-12-02 DIAGNOSIS — G4730 Sleep apnea, unspecified: Secondary | ICD-10-CM | POA: Diagnosis not present

## 2023-12-02 DIAGNOSIS — I10 Essential (primary) hypertension: Secondary | ICD-10-CM | POA: Diagnosis not present

## 2023-12-02 DIAGNOSIS — E66813 Obesity, class 3: Secondary | ICD-10-CM | POA: Diagnosis not present

## 2023-12-02 DIAGNOSIS — Z6841 Body Mass Index (BMI) 40.0 and over, adult: Secondary | ICD-10-CM | POA: Diagnosis not present

## 2023-12-02 HISTORY — PX: ESOPHAGOGASTRODUODENOSCOPY: SHX5428

## 2023-12-02 SURGERY — EGD (ESOPHAGOGASTRODUODENOSCOPY)
Anesthesia: Monitor Anesthesia Care

## 2023-12-02 MED ORDER — LIDOCAINE 2% (20 MG/ML) 5 ML SYRINGE
INTRAMUSCULAR | Status: DC | PRN
  Administered 2023-12-02: 80 mg via INTRAVENOUS

## 2023-12-02 MED ORDER — SODIUM CHLORIDE 0.9 % IV SOLN
INTRAVENOUS | Status: AC | PRN
  Administered 2023-12-02: 500 mL via INTRAVENOUS

## 2023-12-02 MED ORDER — PROPOFOL 500 MG/50ML IV EMUL
INTRAVENOUS | Status: DC | PRN
  Administered 2023-12-02: 40 mg via INTRAVENOUS
  Administered 2023-12-02 (×2): 20 mg via INTRAVENOUS
  Administered 2023-12-02: 40 mg via INTRAVENOUS
  Administered 2023-12-02: 140 ug/kg/min via INTRAVENOUS
  Administered 2023-12-02 (×2): 20 mg via INTRAVENOUS

## 2023-12-02 MED ORDER — PROPOFOL 1000 MG/100ML IV EMUL
INTRAVENOUS | Status: AC
  Filled 2023-12-02: qty 100

## 2023-12-02 MED ORDER — PROPOFOL 500 MG/50ML IV EMUL
INTRAVENOUS | Status: AC
  Filled 2023-12-02: qty 50

## 2023-12-02 NOTE — Transfer of Care (Signed)
 Immediate Anesthesia Transfer of Care Note  Patient: Donald [name]  Procedure(s) Performed: EGD (ESOPHAGOGASTRODUODENOSCOPY)  Patient Location: PACU and Endoscopy Unit  Anesthesia Type:MAC  Level of Consciousness: awake, alert , oriented, and patient cooperative  Airway & Oxygen Therapy: Patient Spontanous Breathing and Patient connected to face mask oxygen  Post-op Assessment: Report given to RN, Post -op Vital signs reviewed and stable, and Patient moving all extremities  Post vital signs: Reviewed and stable  Last Vitals:  Vitals Value Taken Time  BP    Temp    Pulse 83 12/02/23 07:47  Resp 18 12/02/23 07:47  SpO2 99 % 12/02/23 07:47  Vitals shown include unfiled device data.  Last Pain:  Vitals:   12/02/23 0650  TempSrc: Temporal  PainSc: 0-No pain         Complications: No notable events documented.

## 2023-12-02 NOTE — Discharge Instructions (Signed)
      [facility] ENDOSCOPY [address] [place], [place]  [address] Phone:  [phone]   December 02, 2023  Patient: Donald [name]  Date of Birth: 07-21-1985  Date of Visit: December 02, 2023    To Whom It May Concern:  Joal Eakle was seen and treated on December 02, 2023  at 0730 a.m. at [facility]. He should not return to work or operate any machinery or drive for 24 hours after the procedure that occurred  at 0730 am on December 02, 2023.  .           If you have any questions or concerns, please don't hesitate to call [phone].   Sincerely,       Treatment Team:  Attending Provider: Stechschulte, Deward PARAS, MD

## 2023-12-02 NOTE — Anesthesia Procedure Notes (Signed)
 Procedure Name: MAC Date/Time: 12/02/2023 7:21 AM  Performed by: Memory Armida LABOR, CRNAPre-anesthesia Checklist: Patient identified, Emergency Drugs available, Suction available, Patient being monitored and Timeout performed Patient Re-evaluated:Patient Re-evaluated prior to induction Oxygen Delivery Method: Supernova nasal CPAP Placement Confirmation: positive ETCO2 Dental Injury: Teeth and Oropharynx as per pre-operative assessment

## 2023-12-02 NOTE — H&P (Signed)
 Admitting Physician: Deward PARAS Lyndie Vanderloop  Service: Bariatric surgery  CC: preoperative EGD  Subjective   HPI: Donald [name] is an 38 y.o. male who is here for preoperative EGD prior to bariatric surgery  Past Medical History:  Diagnosis Date   Anxiety    Chronic abdominal pain    Depression    Dyspepsia    Erectile dysfunction 03/28/2019   Partly related to SSRI   HTN (hypertension) 10/19/2018   Morbid obesity (HCC) 05/10/2017   Pinched nerve    in his back    Past Surgical History:  Procedure Laterality Date   APPENDECTOMY     July 2019   ESOPHAGOGASTRODUODENOSCOPY N/A 04/02/2014   RMR: Very small hiatial hernia. Gastric polyps status post biopsy. No endoscopic explanation for patient's symptoms. Bengin fundic gland polyp. Negative H.pylori   ESOPHAGOGASTRODUODENOSCOPY (EGD) WITH PROPOFOL  N/A 03/03/2018   Procedure: ESOPHAGOGASTRODUODENOSCOPY (EGD) WITH PROPOFOL ;  Surgeon: Shaaron Lamar CHRISTELLA, MD;  Location: [facility];  Service: Endoscopy;  Laterality: N/A;  12:30pm    Family History  Problem Relation Age of Onset   Diabetes Father    Hypertension Father    Colon cancer Neg Hx    Stomach cancer Neg Hx    Colon polyps Neg Hx     Social:  reports that he has never smoked. He has never used smokeless tobacco. He reports that he does not currently use alcohol. He reports that he does not use drugs.  Allergies: No Known Allergies  Medications: Current Outpatient Medications  Medication Instructions   augmented betamethasone dipropionate (DIPROLENE-AF) 0.05 % cream 1 application , 2 times daily PRN   metoprolol  tartrate (LOPRESSOR ) 100 MG tablet Take 1 tablet 2 hours prior to cardiac ct   pantoprazole  (PROTONIX ) 40 mg, Oral, Daily   sildenafil  (VIAGRA ) 100 MG tablet TAKE 1/2 TO ONE TABLET BY MOUTH EVERY DAY AS NEEDED   sucralfate  (CARAFATE ) 1 g tablet Crush tablet mix with water  take 3 times daily for 1 week    ROS - all of the below systems have been reviewed  with the patient and positives are indicated with bold text General: chills, fever or night sweats Eyes: blurry vision or double vision ENT: epistaxis or sore throat Allergy/Immunology: itchy/watery eyes or nasal congestion Hematologic/Lymphatic: bleeding problems, blood clots or swollen lymph nodes Endocrine: temperature intolerance or unexpected weight changes Breast: new or changing breast lumps or nipple discharge Resp: cough, shortness of breath, or wheezing CV: chest pain or dyspnea on exertion GI: as per HPI GU: dysuria, trouble voiding, or hematuria MSK: joint pain or joint stiffness Neuro: TIA or stroke symptoms Derm: pruritus and skin lesion changes Psych: anxiety and depression  Objective   PE Blood pressure (!) 163/99, pulse 78, temperature 98 F (36.7 C), temperature source Temporal, resp. rate 17, height 6' 2 (1.88 m), weight (!) 158.3 kg, SpO2 96%. Constitutional: NAD; conversant; no deformities Eyes: Moist conjunctiva; no lid lag; anicteric; PERRL Neck: Trachea midline; no thyromegaly Lungs: Normal respiratory effort; no tactile fremitus CV: RRR; no palpable thrills; no pitting edema GI: Abd Soft, nontender; no palpable hepatosplenomegaly MSK: Normal range of motion of extremities; no clubbing/cyanosis Psychiatric: Appropriate affect; alert and oriented x3 Lymphatic: No palpable cervical or axillary lymphadenopathy  No results found for this or any previous visit (from the past 24 hours).  Imaging Orders  No imaging studies ordered today     Assessment and Plan   Donald [name] is an 38 y.o. male with obesithy, here  for EGD prior to bariatric surgery.  I explained my rational for performing upper endoscopy in my bariatric patients. During the procedure I will biopsy for H. Pylori, evaluate for hiatal hernia, evaluate for reflux esophagitis and look for any other abnormalities that may influence the procedure. We discussed the risks, benefits and alternative  to this procedure and the patient granted consent to proceed.   Deward JINNY Foy, MD  [facility] Use [email or web address] to contact on call provider

## 2023-12-02 NOTE — Op Note (Signed)
 [facility] Patient Name: Donald [name] Procedure Date: 12/02/2023 MRN: [id number] Attending MD: Deward JINNY Foy , ,  Date of Birth: [birth year] CSN: [id number] Age: 38 Admit Type: Outpatient Procedure:                Upper GI endoscopy Indications:               Providers:                Deward DOROTHA Foy, Haskel Chris, Technician,                            Ozell Pouch Referring MD:              Medicines:                Monitored Anesthesia Care Complications:            No immediate complications. Estimated Blood Loss:     Estimated blood loss was minimal. Procedure:                Pre-Anesthesia Assessment:                           - Prior to the procedure, a History and Physical                            was performed, and patient medications and                            allergies were reviewed. The patient is competent.                            The risks and benefits of the procedure and the                            sedation options and risks were discussed with the                            patient. All questions were answered and informed                            consent was obtained. Patient identification and                            proposed procedure were verified by the physician.                            Mental Status Examination: normal. Airway                            Examination: normal oropharyngeal airway and neck                            mobility. Respiratory Examination: clear to                            auscultation. CV Examination: normal. ASA Grade  Assessment: II - A patient with mild systemic                            disease. After reviewing the risks and benefits,                            the patient was deemed in satisfactory condition to                            undergo the procedure. The anesthesia plan was to                            use monitored anesthesia care  (MAC). Immediately                            prior to administration of medications, the patient                            was re-assessed for adequacy to receive sedatives.                            The heart rate, respiratory rate, oxygen                            saturations, blood pressure, adequacy of pulmonary                            ventilation, and response to care were monitored                            throughout the procedure. The physical status of                            the patient was re-assessed after the procedure.                           After obtaining informed consent, the endoscope was                            passed under direct vision. Throughout the                            procedure, the patient's blood pressure, pulse, and                            oxygen saturations were monitored continuously. The                            [id number]-H190 ([id number]) Olympus endoscope was introduced                            through the mouth, and advanced to the second part  of duodenum. The upper GI endoscopy was                            accomplished without difficulty. The patient                            tolerated the procedure well. Scope In: Scope Out: Findings:      The examined esophagus was normal.      The Z-line was irregular.      The gastroesophageal flap valve was visualized endoscopically and       classified as Hill Grade I (prominent fold, tight to endoscope).      A single 5 mm pedunculated polyp with no stigmata of recent bleeding was       found in the gastric fundus. Biopsies were taken with a cold forceps for       histology. Verification of patient identification for the specimen was       done. Estimated blood loss was minimal.      The entire examined stomach was normal. Biopsies were taken with a cold       forceps for Helicobacter pylori testing. Verification of patient       identification for the specimen  was done. Estimated blood loss was       minimal.      The examined duodenum was normal. Impression:               - Normal esophagus.                           - Z-line irregular.                           - Gastroesophageal flap valve classified as Hill                            Grade I (prominent fold, tight to endoscope).                           - A single gastric polyp. Biopsied.                           - Normal stomach. Biopsied.                           - Normal examined duodenum. Moderate Sedation:      Moderate (conscious) sedation was personally administered by an       anesthesia professional. The following parameters were monitored: oxygen       saturation, heart rate, blood pressure, and response to care. Total       physician intraservice time was 15 minutes. Recommendation:           - Discharge patient to home.                           - Resume regular diet.                           - Continue present medications.                           -  Await pathology results. Procedure Code(s):        --- Professional ---                           [phone], Esophagogastroduodenoscopy, flexible,                            transoral; with biopsy, single or multiple Diagnosis Code(s):        --- Professional ---                           K22.89, Other specified disease of esophagus                           K31.7, Polyp of stomach and duodenum CPT copyright 2022 [employer]. All rights reserved. The codes documented in this report are preliminary and upon coder review may  be revised to meet current compliance requirements. Deward PARAS Linell Meldrum,  12/02/2023 7:42:43 AM Number of Addenda: 0

## 2023-12-02 NOTE — Anesthesia Postprocedure Evaluation (Signed)
 Anesthesia Post Note  Patient: Donald [name]  Procedure(s) Performed: EGD (ESOPHAGOGASTRODUODENOSCOPY)     Patient location during evaluation: PACU Anesthesia Type: MAC Level of consciousness: awake and alert Pain management: pain level controlled Vital Signs Assessment: post-procedure vital signs reviewed and stable Respiratory status: spontaneous breathing, nonlabored ventilation and respiratory function stable Cardiovascular status: stable and blood pressure returned to baseline Anesthetic complications: no   No notable events documented.  Last Vitals:  Vitals:   12/02/23 0750 12/02/23 0800  BP: (!) 149/84 (!) 150/87  Pulse: 80 72  Resp: 16 (!) 21  Temp:    SpO2: 94% 94%    Last Pain:  Vitals:   12/02/23 0800  TempSrc:   PainSc: 0-No pain                 Debby FORBES Like

## 2023-12-03 ENCOUNTER — Encounter (HOSPITAL_COMMUNITY): Payer: Self-pay | Admitting: Surgery

## 2023-12-03 LAB — SURGICAL PATHOLOGY

## 2023-12-06 ENCOUNTER — Ambulatory Visit (HOSPITAL_COMMUNITY): Payer: Self-pay | Admitting: Licensed Clinical Social Worker

## 2023-12-06 DIAGNOSIS — F4320 Adjustment disorder, unspecified: Secondary | ICD-10-CM

## 2023-12-06 NOTE — Progress Notes (Signed)
 Virtual Visit via Video Note  I connected with Donald [name] on 12/06/23 at  6:00 PM EST by a video enabled telemedicine application and verified that I am speaking with the correct person using two identifiers.  Location: Patient: Primary residence, [place] Provider: Clinician virtual office, [place]   I discussed the limitations of evaluation and management by telemedicine and the availability of in person appointments. The patient expressed understanding and agreed to proceed.   I discussed the assessment and treatment plan with the patient. The patient was provided an opportunity to ask questions and all were answered. The patient agreed with the plan and demonstrated an understanding of the instructions.     Donald [name] is a 38 y.o. year old adult patient reporting to [facility] for preliminary screening to determine bariatric surgery eligibility. Patient reports that they have tried several weight loss interventions in the past, including diet, exercise, keto, wegovy ..  Donald [name] reports current medical concerns/medical history of hypertension, GERD.  Patient reports history of mild/situational anxiety, or other mental health disorders.Pt reports that he took zoloft  after loss of his father several years ago, but has not needed the medication since that time.  Donald [name] denies SI, HI, or perceptual disturbances at time of assessment. Patient denies substance use issues at time of assessment. Donald [name]  reports that they are motivated to make positive changes to contribute to improved wellness and are seeking bariatric weight loss surgery as an intervention to support wellness goals.  Comprehensive Clinical Assessment (CCA) Note  12/06/2023 Donald [name] [id number]  Chief Complaint:  Chief Complaint  Patient presents with   BARIATRIC SCREENING   Visit Diagnosis:  Encounter Diagnosis  Name Primary?   Adjustment disorder, unspecified type Yes   Disposition:   Clinician sees no significant psychological factors that would hinder the success of bariatric surgery at time of assessment. Clinician supports patient candidacy for Bariatric Surgery.   Patient reports realistic expectations post surgery, is aware of the pre and post surgical process, client reports that behavioral health diagnosis(es) are stable at time of assessment, client reports positive pre and post surgical support system, and client reports motivation to make positive change.       CCA Biopsychosocial Intake/Chief Complaint:  BARIATRIC SCREENING  Current Symptoms/Problems: Donald [name] is a 38 y.o. year old adult patient reporting to [facility] for preliminary screening to determine bariatric surgery eligibility. Patient reports that they have tried several weight loss interventions in the past, including diet, exercise, keto, wegovy ..  Donald [name] reports current medical concerns/medical history of hypertension, GERD.  Patient reports history of mild/situational anxiety, or other mental health disorders.Pt reports that he took zoloft  after loss of his father several years ago, but has not needed the medication since that time.  Donald [name] denies SI, HI, or perceptual disturbances at time of assessment. Patient denies substance use issues at time of assessment. Donald [name]  reports that they are motivated to make positive changes to contribute to improved wellness and are seeking bariatric weight loss surgery as an intervention to support wellness goals.   Patient Reported Schizophrenia/Schizoaffective Diagnosis in Past: No   Strengths: Patient reports that he is motivated to make positive changes, patient reports good insight/self-awareness  Preferences: Due to unsuccessful weight loss interventions in the past, patient seeking bariatric weight loss surgery  Abilities: Patient has ability to engage in physical activity, patient has ability to work full-time,  patient has ability  to understand overall life impact of weight loss surgery, patient has the ability to research and recognize behavioral modifications needed for weight loss success   Type of Services Patient Feels are Needed: Bariatric weight loss surgery   Initial Clinical Notes/Concerns: Patient reports that he is not currently being treated for depression or anxiety at time of session   Mental Health Symptoms Depression:  Fatigue; Change in energy/activity (low energy,)   Duration of Depressive symptoms: Greater than two weeks   Mania:  None   Anxiety:   None   Psychosis:  None   Duration of Psychotic symptoms: No data recorded  Trauma:  None   Obsessions:  None   Compulsions:  None   Inattention:  Symptoms before age 80   Hyperactivity/Impulsivity:  Symptoms present before age 95   Oppositional/Defiant Behaviors:  None   Emotional Irregularity:  None   Other Mood/Personality Symptoms:  Patient reports that he has taken Zoloft  in the past to manage depression after loss of father.  Patient reports that he discontinued medication and he manages his symptoms well currently    Mental Status Exam Appearance and self-care  Stature:  Average   Weight:  Obese   Clothing:  Casual   Grooming:  Well-groomed   Cosmetic use:  Age appropriate   Posture/gait:  Normal   Motor activity:  Not Remarkable   Sensorium  Attention:  Normal   Concentration:  Normal   Orientation:  X5   Recall/memory:  Normal   Affect and Mood  Affect:  Appropriate   Mood:  Other (Comment) (Within normal limits)   Relating  Eye contact:  Normal   Facial expression:  Responsive   Attitude toward examiner:  Cooperative   Thought and Language  Speech flow: Clear and Coherent   Thought content:  Appropriate to Mood and Circumstances   Preoccupation:  None   Hallucinations:  None   Organization:  No data recorded  [employer] of Knowledge:  Good    Intelligence:  Average   Abstraction:  Normal   Judgement:  Good   Reality Testing:  Realistic   Insight:  Good   Decision Making:  Normal   Social Functioning  Social Maturity:  Responsible   Social Judgement:  Normal   Stress  Stressors:  Illness (worrying about son's mental heath)   Coping Ability:  Normal   Skill Deficits:  None   Supports:  Friends/Service system; Family     Religion: Religion/Spirituality Are You A Religious Person?: No How Might This Affect Treatment?: no barriers to treatment  Leisure/Recreation: Leisure / Recreation Do You Have Hobbies?: Yes Leisure and Hobbies: woodworking, watch son play baseball, being outside fishing, presenter, broadcasting  Exercise/Diet: Exercise/Diet Do You Exercise?: Yes (pt job is physically intensive.) What Type of Exercise Do You Do?: Run/Walk How Many Times a Week Do You Exercise?: 4-5 times a week Have You Gained or Lost A Significant Amount of Weight in the Past Six Months?: No Do You Follow a Special Diet?: Yes Type of Diet: coming off soda--trying to drink more water  Do You Have Any Trouble Sleeping?: Yes Explanation of Sleeping Difficulties: pt doesn't have issues with sleep   CCA Employment/Education Employment/Work Situation: Employment / Work Situation Employment Situation: Employed Where is Patient Currently Employed?: [place] [employer] [place]--was with [employer]. How Long has Patient Been Employed?: 3 years Are You Satisfied With Your Job?: Yes Work Stressors: none Patient's Job has Been Impacted by Current Illness: No Has Patient  ever Been in the [employer]?: No  Education: Education Is Patient Currently Attending School?: No Last Grade Completed: 12 Did You Graduate From [employer]?: Yes Did You Attend College?: No Did You Attend Graduate School?: No Did You Have An Individualized Education Program (IIEP): No Did You Have Any Difficulty At School?: No Patient's Education Has Been  Impacted by Current Illness: No   CCA Family/Childhood History Family and Relationship History: Family history Marital status: Married Number of Years Married: 10 Does patient have children?: Yes How many children?: 2 How is patient's relationship with their children?: 37 year old and 1 year old  Childhood History:  Childhood History By whom was/is the patient raised?: Both parents Additional childhood history information: Pt lived with both parents and paternal grandmother resided with them. How were you disciplined when you got in trouble as a child/adolescent?: fair discipline Does patient have siblings?: Yes Number of Siblings: 3 Description of patient's current relationship with siblings: brothers and sisters Did patient suffer any verbal/emotional/physical/sexual abuse as a child?: No Did patient suffer from severe childhood neglect?: No Has patient ever been sexually abused/assaulted/raped as an adolescent or adult?: No Was the patient ever a victim of a crime or a disaster?: No Witnessed domestic violence?: No Has patient been affected by domestic violence as an adult?: No  Child/Adolescent Assessment:     CCA Substance Use Alcohol/Drug Use: Alcohol / Drug Use Pain Medications: SEE MAR Prescriptions: SEE MAR Over the Counter: SEE MAR History of alcohol / drug use?: No history of alcohol / drug abuse Longest period of sobriety (when/how long): social/rare etoh Negative Consequences of Use:  (none) Withdrawal Symptoms: None ASAM's:  Six Dimensions of Multidimensional Assessment  Dimension 1:  Acute Intoxication and/or Withdrawal Potential:   Dimension 1:  Description of individual's past and current experiences of substance use and withdrawal: social/rare etoh  Dimension 2:  Biomedical Conditions and Complications:   Dimension 2:  Description of patient's biomedical conditions and  complications: none  Dimension 3:  Emotional, Behavioral, or Cognitive Conditions  and Complications:  Dimension 3:  Description of emotional, behavioral, or cognitive conditions and complications: none  Dimension 4:  Readiness to Change:  Dimension 4:  Description of Readiness to Change criteria: none  Dimension 5:  Relapse, Continued use, or Continued Problem Potential:  Dimension 5:  Relapse, continued use, or continued problem potential critiera description: none  Dimension 6:  Recovery/Living Environment:  Dimension 6:  Recovery/Iiving environment criteria description: none  ASAM Severity Score: ASAM's Severity Rating Score: 0  ASAM Recommended Level of Treatment: ASAM Recommended Level of Treatment: Level I Outpatient Treatment   Substance use Disorder (SUD) Substance Use Disorder (SUD)  Checklist Symptoms of Substance Use:  (none)  Recommendations for Services/Supports/Treatments: Recommendations for Services/Supports/Treatments Recommendations For Services/Supports/Treatments: Individual Therapy (psychotherapy PRN)  DSM5 Diagnoses: Patient Active Problem List   Diagnosis Date Noted   Erectile dysfunction 03/28/2019   HTN (hypertension) 10/19/2018   First degree AV block 09/15/2018   GERD (gastroesophageal reflux disease) 05/24/2018   Melena 02/25/2018   Morbid obesity (HCC) 05/10/2017   GAD (generalized anxiety disorder) 10/09/2016   Dyspepsia 03/20/2014    Patient Centered Plan: Patient is on the following Treatment Plan(s):    Behavioral Health Assessment  Patient Name Donald [name] Date of Birth:  1985-08-12 Age:  38 y.o. Date of Interview:  12/06/23 Gender:  M   Date of Report : 12/06/23 Purpose:   Bariatric/Weight-loss Surgery (pre-operative evaluation)    Assessment Instruments:  DSM-5-TR Self-Rated Level 1 Cross-Cutting Symptom Measure--Adult Severity Measure for Generalized Anxiety Disorder--Adult EAT-26 (Eating Attitudes Test) SSS-8 (Somatic Symptom Scale) BES (Binge Eating Scale)  Chief Complaint: BARIATRIC SCREENING  Client  Background: Patient is a 38 yo male seeking weight loss surgery. Patient has high school education and is currently working for the city of [place].  Patients marital status is married with two children.   The patient is 6 feet  2 inches tall and 349 lbs., reflecting a BMI of 44.81 classifying patient in the obese range and at further risk of co-morbid diseases.  Tobacco Use: Patient denies tobacco use.   PATIENT BEHAVIORAL ASSESSMENT SCORES  Personal History of Mental Illness: Patient reports that he tried Zoloft  briefly to manage depression triggered by the death of his father.  Patient reports that he has discontinued the medication and does not need it at time of session.  Patient reports that it has been several years since he took this medication.  Mental Status Examination: Patient was oriented x5 (person, place, situation, time, and object). Patient was appropriately groomed, and neatly dressed. Patient was alert, engaged, pleasant, and cooperative. Patient denies suicidal and homicidal ideations or any perceptual disturbances. Patient denies self-injury.   DSM-5-TR Self-Rated Level 1 Cross-Cutting Symptom Measure--Adult: Patient completed 23-item questionnaire assessing symptoms related to depression, anger, mania, anxiety, somatic symptoms, suicidal ideation, psychosis, insomnia, memory concerns, repetitive behaviors, dissociation, personality functioning and substance use. Donald [name] scored 3 in depression, anxiety domains.   Severity Measure for Generalized Anxiety Disorder--Adult: Patient completed a 10-item  scale. Total scores can range from 0 to 40. A raw score is calculated by summing the answer to each question, and an average total score is achieved by dividing the raw score by the number of items (e.g., 10) Raw-T score scoring conversion: 7-15 none to slight, 16-19 mild, 20-27 moderate, 28+ severe.  Costella CHRISTELLA [name] had a total raw score of 2 out of 40 which indicates none  to slight anxiety.   EAT-26: The EAT-26 is a twenty-six-question screening tool to identify symptoms of dieting behaviors, bulimia, food preoccupation and oral control.  Scores below a 20 are considered not meeting criteria for disordered eating. Donald [name] scored 3 out of 26. Patient denies inducing vomiting, or intentional meal skipping. Patient denies binge eating behaviors. Patient denies laxative abuse. Patient does not meet criteria for a DSM-V eating disorder.  SSS-8: The SSS-8, or Somatic Symptom Scale-8, is a brief self-report questionnaire used to assess the perceived burden of common somatic (physical) symptoms.  (SSS-8) is scored by summing the responses to eight items, each rated on a 5-point Likert scale from 0 (Not at all) to 4 (Very much). Total scores range from 0 to 32, with higher scores indicating greater somatic symptom burden. 0-3 indicate minimal burden, 4-7 indicate low burden, 8-11 indicate medium burden, 12-15 indicate high burden, 16-32 indicate very high burden.  Donald [name] scored 3 out of 32, which indicates minimal burden score.   BES: The Binge Eating Scale (BES) is a self-report questionnaire developed to assess the presence and severity of binge eating behavior, particularly in individuals who may be struggling with obesity or disordered eating patterns.  Possible total scores range from 0 to 32 with higher scores indicating more severe binge eating symptoms. Based on the BES total score, individuals can be categorized into three groups according to established cut-scores of binge eating severity.  These groups are characterized by no binge eating (score <= 17),  mild to moderate binge eating (score of 18 - 26) and severe binge eating (score >= 27). A frequent convention is to use the BES as a screening measure to classify all participants with scores greater than or equal to 17 as "binge eaters.SABRA Donald CHRISTELLA Pasco scored 2 out of 32, which indicates no binge  eating  score.   Conclusion & Recommendations:   Health history and current assessment reflect that patient is suitable to be a candidate for bariatric surgery. Patient understands the procedure, the risks associated with it, and the importance of post-operative holistic care (Physical, spiritual/values, relationships, and mental/emotional health) with access to resources for support as needed. The patient has made an informed decision to proceed with procedure. The patient is motivated and expressed understanding of the post-surgical requirements. Patient's psychological assessment will be valid from today's date for 6 months (06/04/24). After that date, a follow-up appointment will be needed to re-evaluate the patient's psychological status.   Clinician sees no significant psychological factors that would hinder the success of bariatric surgery at time of assessment. Clinician supports patient candidacy for Bariatric Surgery.   Tawni JONELLE Brisker, MSW, LCSW Licensed Clinical Social [facility]     Referrals to Alternative Service(s): Referred to Alternative Service(s):   Place:   Date:   Time:    Referred to Alternative Service(s):   Place:   Date:   Time:    Referred to Alternative Service(s):   Place:   Date:   Time:    Referred to Alternative Service(s):   Place:   Date:   Time:      Collaboration of Care: Other patient encouraged to follow ongoing recommendations of bariatric team providers.  Psychotherapy recommended as needed  Patient/Guardian was advised Release of Information must be obtained prior to any record release in order to collaborate their care with an outside provider. Patient/Guardian was advised if they have not already done so to contact the registration department to sign all necessary forms in order for us  to release information regarding their care.   Consent: Patient/Guardian gives verbal consent for treatment and assignment of benefits  for services provided during this visit. Patient/Guardian expressed understanding and agreed to proceed.   Gabryela Kimbrell R Mcclellan Demarais, LCSW

## 2023-12-08 ENCOUNTER — Ambulatory Visit (HOSPITAL_COMMUNITY)
Admission: RE | Admit: 2023-12-08 | Discharge: 2023-12-08 | Disposition: A | Discharge status: Home / Self Care | Source: Ambulatory Visit | Attending: Physician Assistant | Admitting: Physician Assistant

## 2023-12-08 DIAGNOSIS — R0609 Other forms of dyspnea: Secondary | ICD-10-CM | POA: Diagnosis not present

## 2023-12-08 DIAGNOSIS — R072 Precordial pain: Secondary | ICD-10-CM | POA: Diagnosis not present

## 2023-12-08 DIAGNOSIS — R0602 Shortness of breath: Secondary | ICD-10-CM | POA: Diagnosis not present

## 2023-12-08 LAB — ECHOCARDIOGRAM COMPLETE
AR max vel: 3.22 cm2
AV Area VTI: 3.43 cm2
AV Area mean vel: 3.55 cm2
AV Mean grad: 3 mmHg
AV Peak grad: 8.3 mmHg
Ao pk vel: 1.44 m/s
Area-P 1/2: 3.39 cm2
S' Lateral: 3.07 cm

## 2023-12-08 MED ORDER — PERFLUTREN LIPID MICROSPHERE
1.0000 mL | INTRAVENOUS | Status: AC | PRN
  Administered 2023-12-08: 5 mL via INTRAVENOUS

## 2023-12-14 ENCOUNTER — Encounter: Payer: Self-pay | Admitting: Physician Assistant

## 2023-12-14 ENCOUNTER — Ambulatory Visit: Attending: Physician Assistant | Admitting: Physician Assistant

## 2023-12-14 VITALS — BP 124/82 | HR 78 | Ht 74.0 in | Wt 349.8 lb

## 2023-12-14 DIAGNOSIS — R079 Chest pain, unspecified: Secondary | ICD-10-CM | POA: Diagnosis not present

## 2023-12-14 DIAGNOSIS — G4733 Obstructive sleep apnea (adult) (pediatric): Secondary | ICD-10-CM

## 2023-12-14 NOTE — Patient Instructions (Signed)
 Medication Instructions:  NO CHANGES *If you need a refill on your cardiac medications before your next appointment, please call your pharmacy*  Lab Work: NO LABS If you have labs (blood work) drawn today and your tests are completely normal, you will receive your results only by: MyChart Message (if you have MyChart) OR A paper copy in the mail If you have any lab test that is abnormal or we need to change your treatment, we will call you to review the results.  Testing/Procedures: NO TESTING  Follow-Up: At [facility], you and your health needs are our priority.  As part of our continuing mission to provide you with exceptional heart care, our providers are all part of one team.  This team includes your primary Cardiologist (physician) and Advanced Practice Providers or APPs (Physician Assistants and Nurse Practitioners) who all work together to provide you with the care you need, when you need it.  Your next appointment:   FOLLOW UP AS NEEDED

## 2023-12-14 NOTE — Progress Notes (Unsigned)
  Cardiology Office Note   Date:  12/14/2023  ID:  Seaver, Machia [birth year], MRN [id number] PCP: Alphonsa Glendia LABOR, MD  [facility] Providers Cardiologist:  None { Click to update primary MD,subspecialty MD or APP then REFRESH:1}    History of Present Illness LEDGER HEINDL is a 38 y.o. male with past medical history of morbid obesity, elevated blood pressure, prediabetes and hyperlipidemia.  He was recently seen by PCP on 10/20/2023 for chest pain.  He went to the ED that same night due to worsening chest discomfort.  Troponin was negative.  White blood cell count elevated at 12.9.  Normal renal function and electrolyte.  Chest x-ray showed no acute finding.   I have recently seen the patient in the [facility] on 11/01/2023.  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  He had intermittent chest discomfort for 2 weeks that was substernal in location.  He also had intermittent shortness of breath.  Father was diagnosed with heart failure and underwent bypass surgery in his early 22s therefore patient was quite concerned about his family history.  I ordered echocardiogram and coronary CTA.  He previously was diagnosed with mild obstructive sleep apnea on home sleep study in 2021, however never underwent CPAP titration.  His wife has previously mentioned to him that he snores during sleep.  I urged him to follow-up with Dr. [name] of neurology service who managed his obstructive sleep apnea.  D-dimer was negative.  CTA of the chest obtained on 11/11/2023 showed coronary calcium score of 0, normal coronary arteries, mild bronchial thickening of the heterogeneous pulmonary parenchyma which can be seen in the small airway disease.  Subsequent echocardiogram obtained on 12/08/2023 showed EF 60 to 65%, no significant valve issue.  Patient presents today for follow-up.  I reviewed the recent coronary CTA and echocardiogram with the patient.  His shortness of breath with  exertion is chronic and has not worsened recently.  I will hold off on pulmonology referral.  He has no lower extremity edema and his lung is clear.  He will need to establish with sleep medicine provider to manage CPAP.  Otherwise, he is doing well from a cardiac perspective and has not had any recent chest pain.  He can follow-up with cardiology service as needed  ROS: ***  Studies Reviewed      *** Risk Assessment/Calculations {Does this patient have ATRIAL FIBRILLATION?:[phone]}         Physical Exam VS:  BP 124/82   Pulse 78   Ht 6' 2 (1.88 m)   Wt (!) 349 lb 12.8 oz (158.7 kg)   SpO2 96%   BMI 44.91 kg/m        Wt Readings from Last 3 Encounters:  12/14/23 (!) 349 lb 12.8 oz (158.7 kg)  12/02/23 (!) 349 lb (158.3 kg)  11/01/23 (!) 350 lb (158.8 kg)    GEN: Well nourished, well developed in no acute distress NECK: No JVD; No carotid bruits CARDIAC: ***RRR, no murmurs, rubs, gallops RESPIRATORY:  Clear to auscultation without rales, wheezing or rhonchi  ABDOMEN: Soft, non-tender, non-distended EXTREMITIES:  No edema; No deformity   ASSESSMENT AND PLAN ***    {Are you ordering a CV Procedure (e.g. stress test, cath, DCCV, TEE, etc)?   Press F2        :[id number]}  Dispo: ***  Signed, Scot Ford, PA

## 2023-12-16 ENCOUNTER — Encounter: Payer: Self-pay | Admitting: Dietician

## 2023-12-16 ENCOUNTER — Encounter: Attending: Surgery | Admitting: Dietician

## 2023-12-16 VITALS — Ht 74.0 in | Wt 354.4 lb

## 2023-12-16 DIAGNOSIS — E669 Obesity, unspecified: Secondary | ICD-10-CM | POA: Diagnosis not present

## 2023-12-16 NOTE — Progress Notes (Signed)
 Nutrition Assessment for Bariatric Surgery: Pre-Surgery Behavioral and Nutrition Intervention Program   Medical Nutrition Therapy  Appt Start Time: 1630    End Time: 1736  Patient was seen on 12/16/2023 for Pre-Operative Nutrition Assessment. Purpose of todays visit  enhance perioperative outcomes along with a healthy weight maintenance   Referral stated Supervised Weight Loss (SWL) visits needed: 0  Pt completed visits.   Pt has cleared nutrition requirements.   Planned surgery: Gastric By-Pass Pt expectation of surgery: weight loss for healthier body  NUTRITION ASSESSMENT   Anthropometrics  Start weight at [facility]: 354.4 lbs (date: 12/16/2023)  Height: 74 in BMI: 45.50 kg/m2     Clinical   Pharmacotherapy: History of weight loss medication used: Wegovy  (insurance stopped paying)  Medical hx: obesity, HTN Medications: pantoprazole  Supplements: vit D,  multivitamin Labs: vit D 13.7, A1c 5.9, LDL 122,  Notable signs/symptoms: none noted Any previous deficiencies? No  Evaluation of Nutritional Deficiencies: Micronutrient Nutrition Focused Physical Exam: Hair: No issues observed Eyes: No issues observed Mouth: No issues observed Neck: No issues observed Nails: No issues observed Skin: No issues observed  Lifestyle & Dietary Hx Pt states he does not want to get diabetes, stating he has been around it his whole life, stating his father and wife.  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 Pt states since he has been taking vit D, he states he feels less drowsy. Pt states he mother had the gastric by-pass surgery.  Current Physical Activity Recommendations state 150 minutes per week of moderate to vigorous movement including Cardio and 1-2 days of resistance activities as well as flexibility/balance activities:  Pts current  physical activity: ADLs; throwing the ball with his boys/kids, with 0% recommendation reached   Sleep Hygiene: duration and quality: good; pt states he has a CPAP machine, stating he does not use it. 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  Current Patient Perceived Stress Level as stated by pt on a scale of 1-10:  1       Stress Management Techniques: take deep breaths and walk away  According to the Dietary Guidelines for Americans Recommendation: equivalent 1.5-2 cups fruits per day, equivalent 2-3 cups vegetables per day and at least half all grains whole  Fruit servings per day (on average): 0, meeting 0% recommendation  Non-starchy vegetable servings per day (on average): 2-3, meeting 100% recommendation  Whole Grains per day (on average): 0  Number of meals missed/skipped per week out of 21: 0  24-Hr Dietary Recall First Meal: biscuit (bacon egg and cheese) 4 hours after waking at 1 am. Snack:  Second Meal: vegetables and a meat Snack:  Third Meal: easy and fast like pizza or spaghetti or or sometimes take out or sandwich when the boys have practice Snack:  Beverages: water , zero sugar soda at dinner  Alcoholic beverages per week: 0   Estimated Energy Needs Calories: 1600  NUTRITION DIAGNOSIS  Overweight/obesity (Nichols-3.3) related to past poor dietary habits and physical inactivity as evidenced by patient w/ planned gastric by-pass surgery following dietary guidelines for continued weight loss.  NUTRITION INTERVENTION  Nutrition counseling (C-1) and education (E-2) to facilitate bariatric surgery goals.  Educated pt on micronutrient deficiencies post-surgery and behavioral/dietary strategies to start in order to mitigate that risk   Behavioral and Dietary Interventions Pre-Op Goals  Reviewed with the Patient Nutrition: Healthy Eating Behaviors Switch to non-caloric, non-carbonated and non-caffeinated beverages such as  water , unsweetened tea, Crystal Light and zero  calorie beverages (aim for 64 oz. per day) Cut out grazing between meals or at night  Find a protein shake you like Eat every 3-5 hours        Eliminate distractions while eating (TV, computer, reading, driving, texting) Take 79-69 minutes to eat a meal  Decrease high sugar foods/decrease high fat/fried foods Eliminate alcoholic beverages Increase protein intake (eggs, fish, chicken, yogurt) before surgery Eat non starchy vegetables 2 times a day 7 days a week Eat complex carbohydrates such as whole grains and fruits   Behavioral Modification: Physical Activity Increase my usual daily activity (use stairs, park farther, etc.) Engage in _______________________  activity  _______ minutes ______ times per week  Other:    *Goals that are bolded indicate the pt would like to start working towards these  Handouts Provided Include  Bariatric Surgery handouts (Nutrition Visits, Pre Surgery Behavioral Change Goals, Protein Shakes Brands to Choose From, Vitamins & Mineral Supplementation) Bariatric MyPlate  Learning Style & Readiness for Change Teaching method utilized: Visual, Auditory, and hands on  Demonstrated degree of understanding via: Teach Back  Readiness Level: preparation Barriers to learning/adherence to lifestyle change: nothing identified  RD's Notes for Next Visit    MONITORING & EVALUATION Dietary intake, weekly physical activity, body weight, and preoperative behavioral change goals   Next Steps  Pt to follow up at [facility] for pre-op class >2 weeks prior to scheduled surgery.

## 2023-12-24 DIAGNOSIS — K449 Diaphragmatic hernia without obstruction or gangrene: Secondary | ICD-10-CM | POA: Diagnosis not present

## 2023-12-24 DIAGNOSIS — E7849 Other hyperlipidemia: Secondary | ICD-10-CM | POA: Diagnosis not present

## 2023-12-28 ENCOUNTER — Encounter

## 2024-01-04 ENCOUNTER — Ambulatory Visit: Admitting: Cardiology

## 2024-01-07 ENCOUNTER — Ambulatory Visit: Admitting: Family Medicine

## 2024-01-07 VITALS — BP 132/90 | HR 76 | Temp 98.8°F | Ht 74.0 in | Wt 350.0 lb

## 2024-01-07 DIAGNOSIS — K219 Gastro-esophageal reflux disease without esophagitis: Secondary | ICD-10-CM

## 2024-01-07 DIAGNOSIS — R030 Elevated blood-pressure reading, without diagnosis of hypertension: Secondary | ICD-10-CM | POA: Diagnosis not present

## 2024-01-07 MED ORDER — PANTOPRAZOLE SODIUM 40 MG PO TBEC: 40.0000 mg | DELAYED_RELEASE_TABLET | Freq: Every day | ORAL | 5 refills | Status: DC

## 2024-01-07 NOTE — Progress Notes (Signed)
" ° °  Subjective:    Patient ID: Donald [name], male    DOB: Jan 14, 1985, 38 y.o.   MRN: [id number]  HPI  Room 8  PT is here for 6 month follow up Patient is trying to watch his appetite and weight.  Patient trying to stay active He has upcoming gastric bypass surgery later in January Stress levels are reasonable Stays busy with work  PT has no new concerns  Review of Systems     Objective:   Physical Exam General-in no acute distress Eyes-no discharge Lungs-respiratory rate normal, CTA CV-no murmurs,RRR Extremities skin warm dry no edema Neuro grossly normal Behavior normal, alert        Assessment & Plan:  1. Elevated blood pressure reading (Primary) Blood pressure reading 132/90 Patient will be having surgery for weight reduction coming up With weight reduction his blood pressure should come under good control He will let us  know if with his visits with the surgeon or anesthesiologist if blood pressure is significantly elevated we would start low-dose amlodipine until he starts losing significant amount of weight  2. Morbid obesity (HCC) Portion control regular physical activity he has weight loss surgery end of January this is a good idea for him  3. Gastroesophageal reflux disease without esophagitis Continue medication for now   "

## 2024-01-10 ENCOUNTER — Encounter: Admitting: Skilled Nursing Facility1

## 2024-01-10 ENCOUNTER — Ambulatory Visit: Payer: Self-pay | Admitting: Surgery

## 2024-01-10 DIAGNOSIS — Z01818 Encounter for other preprocedural examination: Secondary | ICD-10-CM

## 2024-01-11 ENCOUNTER — Encounter: Payer: Self-pay | Admitting: Skilled Nursing Facility1

## 2024-01-11 NOTE — Progress Notes (Signed)
 Pre-Operative Nutrition Class:    Class start Time: 5:13   Class End Time: 6:15  This was a class of 4 patients.   Patient was seen on 01/10/2024 for Pre-Operative Bariatric Surgery Education [facility]    Surgery date: 02/08/2024 Surgery type: RYGB Start weight at [facility]: 354.4 Weight today: 352  Samples given per MNT protocol. Patient educated on appropriate usage: Celebrate Vitamins Multivitamin Lot # [id number] Exp: 09/26   Celebrate Vitamins Calcium  Lot # [id number] Exp: 12/25   Ensure Max Protein Shake Lot # [id number] Exp: 1JUL 2026  The following the learning objectives were met by the patient during this course: Identify Pre-Op Dietary Goals and will begin 2 weeks pre-operatively Identify appropriate sources of fluids and proteins  State protein recommendations and appropriate sources pre and post-operatively Identify Post-Operative Dietary Goals and will follow for 2 weeks post-operatively Identify appropriate multivitamin, calcium, and thiamin sources Describe the need for physical activity post-operatively and will follow MD recommendations State when to call healthcare provider regarding medication questions or post-operative complications When having a diagnosis of diabetes understanding hypoglycemia symptoms and the inclusion of 1 complex carbohydrate per meal  Handouts given during class include: Pre-Op Bariatric Surgery Diet Handout Protein Shake Handout Post-Op Bariatric Surgery Nutrition Handout BELT Program Information Flyer [employer] Information Flyer [facility] Bariatric Supplements Price List  Follow-Up Plan: Patient will follow-up at [facility] 2 weeks post operatively for diet advancement per MD.

## 2024-01-31 NOTE — Discharge Instructions (Signed)
 GASTRIC BYPASS / SLEEVE  Home Care Instructions  These instructions are to help you care for yourself when you go home.  Call: If you have any problems. Call [phone] and ask for the surgeon on call If you have an emergency related to your surgery please use the ER at [facility].  Tell the ER staff that you are a new post-op gastric bypass or gastric sleeve patient   Signs and symptoms to report: Severe vomiting or nausea If you cannot handle clear liquids for longer than 1 day, call your surgeon  Abdominal pain which does not get better after taking your pain medication Fever greater than 100.4 F and chills Heart rate over 100 beats a minute Trouble breathing Chest pain  Redness, swelling, drainage, or foul odor at incision (surgical) sites  If your incisions open or pull apart Swelling or pain in calf (lower leg) Diarrhea (Loose bowel movements that happen often), frequent watery, uncontrolled bowel movements Constipation, (no bowel movements for 3 days) if this happens:  Take Milk of Magnesia, 2 tablespoons by mouth, 3 times a day for 2 days if needed Stop taking Milk of Magnesia once you have had a bowel movement Call your doctor if constipation continues Or Take Miralax  (instead of Milk of Magnesia) following the label instructions Stop taking Miralax once you have had a bowel movement Call your doctor if constipation continues Anything you think is "abnormal for you"   Normal side effects after surgery: Unable to sleep at night or unable to concentrate Irritability Being tearful (crying) or depressed These are common complaints, possibly related to your anesthesia, stress of surgery and change in lifestyle, that usually go away a few weeks after surgery.  If these feelings continue, call your medical doctor.  Wound Care: You may have surgical glue, steri-strips, or staples over your incisions after surgery Surgical glue:  Looks like a clear film over your incisions  and will wear off a little at a time Steri-strips : Adhesive strips of tape over your incisions. You may notice a yellowish color on the skin under the steri-strips. This is used to make the   steri-strips stick better. Do not pull the steri-strips off - let them fall off Staples: Staples may be removed before you leave the hospital If you go home with staples, call [facility] at for an appointment with your surgeon's nurse to have staples removed 10 days after surgery, [phone] Showering: You may shower two (2) days after your surgery unless your surgeon tells you differently Wash gently around incisions with warm soapy water , rinse well, and gently pat dry  If you have a drain (tube from your incision), you may need someone to hold this while you shower  No tub baths until staples are removed and incisions are healed     Medications: Medications should be liquid or crushed if larger than the size of a dime Extended release pills (medication that releases a little bit at a time through the day) should not be crushed Depending on the size and number of medications you take, you may need to space (take a few throughout the day)/change the time you take your medications so that you do not over-fill your pouch (smaller stomach) Make sure you follow-up with your primary care physician to make medication changes needed during rapid weight loss and life-style changes If you have diabetes, follow up with the doctor that orders your diabetes medication(s) within one week after surgery and check  your blood sugar regularly. Do not drive while taking narcotics (pain medications) DO NOT take NSAID'S (Examples of NSAID's include ibuprofen, naproxen )  Diet:                    First 2 Weeks  You will see the nutritionist about two (2) weeks after your surgery. The nutritionist will increase the types of foods you can eat if you are handling liquids well: If you have severe vomiting or nausea  and cannot handle clear liquids lasting longer than 1 day, call your surgeon  Protein Shake Drink at least 2 ounces of shake 5-6 times per day Each serving of protein shakes (usually 8 - 12 ounces) should have a minimum of:  15 grams of protein  And no more than 5 grams of carbohydrate  Goal for protein each day: Men = 80 grams per day Women = 60 grams per day Protein powder may be added to fluids such as non-fat milk or Lactaid milk or Soy milk (limit to 35 grams added protein powder per serving)  Hydration Slowly increase the amount of water  and other clear liquids as tolerated (See Acceptable Fluids) Slowly increase the amount of protein shake as tolerated   Sip fluids slowly and throughout the day May use sugar substitutes in small amounts (no more than 6 - 8 packets per day; i.e. Splenda)  Fluid Goal The first goal is to drink at least 8 ounces of protein shake/drink per day (or as directed by the nutritionist);  See handout from pre-op Bariatric Education Class for examples of protein shake/drink.   Slowly increase the amount of protein shake you drink as tolerated You may find it easier to slowly sip shakes throughout the day It is important to get your proteins in first Your fluid goal is to drink 64 - 100 ounces of fluid daily It may take a few weeks to build up to this 32 oz (or more) should be clear liquids  And  32 oz (or more) should be full liquids (see below for examples) Liquids should not contain sugar, caffeine, or carbonation  Clear Liquids: Water  or Sugar-free flavored water  (i.e. Fruit H2O, Propel) Decaffeinated coffee or tea (sugar-free) Crystal Lite, Wyler's Lite, Minute Maid Lite Sugar-free Jell-O Bouillon or broth Sugar-free Popsicle:   *Less than 20 calories each; Limit 1 per day  Full Liquids: Protein Shakes/Drinks + 2 choices per day of other full liquids Full liquids must be: No More Than 12 grams of Carbs per serving  No More Than 3 grams of Fat  per serving Strained low-fat cream soup Non-Fat milk Fat-free Lactaid Milk Sugar-free yogurt (Dannon Lite & Fit, Greek yogurt)      Vitamins and Minerals Start 1 day after surgery unless otherwise directed by your surgeon Bariatric Specific Complete Multivitamins Chewable Calcium  Citrate with Vitamin D -3 (Example: 3 Chewable Calcium  Plus 600 with Vitamin D -3) Take 500 mg three (3) times a day for a total of 1500 mg each day Do not take all 3 doses of calcium  at one time as it may cause constipation, and you can only absorb 500 mg  at a time  Do not mix multivitamins containing iron with calcium  supplements; take 2 hours apart  Menstruating women and those at risk for anemia (a blood disease that causes weakness) may need extra iron Talk with your doctor to see if you need more iron If you need extra iron: Total daily Iron recommendation (including Vitamins) is 50 to 100  mg Iron/day Do not stop taking or change any vitamins or minerals until you talk to your nutritionist or surgeon Your nutritionist and/or surgeon must approve all vitamin and mineral supplements   Activity and Exercise: It is important to continue walking at home.  Limit your physical activity as instructed by your doctor.  During this time, use these guidelines: Do not lift anything greater than ten (10) pounds for at least two (2) weeks Do not go back to work or drive until Designer, industrial/product says you can You may have sex when you feel comfortable  It is VERY important for male patients to use a reliable birth control method; fertility often increases after surgery  Do not get pregnant for at least 18 months Start exercising as soon as your doctor tells you that you can Make sure your doctor approves any physical activity Start with a simple walking program Walk 5-15 minutes each day, 7 days per week.  Slowly increase until you are walking 30-45 minutes per day Consider joining our BELT program. [phone] or email  [email or web address]   Special Instructions Things to remember:  Use your CPAP when sleeping if this applies to you, do not stop the use of CPAP unless directed by physician after a sleep study [facility] has a free Bariatric Surgery Support Group that meets monthly, the 3rd Thursday, 6 pm.  Please review discharge information for date and location of this meeting. It is very important to keep all follow up appointments with your surgeon, nutritionist, primary care physician, and behavioral health practitioner After the first year, please follow up with your bariatric surgeon and nutritionist at least once a year in order to maintain best weight loss results   [facility]: [phone] [facility]: [phone] Bariatric Nurse Coordinator: [phone]

## 2024-02-02 NOTE — Patient Instructions (Signed)
 SURGICAL WAITING ROOM VISITATION  Patients having surgery or a procedure may have no more than 2 support people in the waiting area - these visitors may rotate.    Children ages 48 and under will not be able to visit patients in [facility] under most circumstances.   Visitors with respiratory illnesses are discouraged from visiting and should remain at home.  If the patient needs to stay at the hospital during part of their recovery, the visitor guidelines for inpatient rooms apply. Pre-op nurse will coordinate an appropriate time for 1 support person to accompany patient in pre-op.  This support person may not rotate.    Please refer to the [facility] website for the visitor guidelines for Inpatients (after your surgery is over and you are in a regular room).    Your procedure is scheduled on: 02/08/24   Report to [facility] Main Entrance    Report to admitting at 5:15 AM   Call this number if you have problems the morning of surgery [phone]   MORNING OF SURGERY DRINK:   DRINK 1 G2 drink BEFORE YOU LEAVE HOME, DRINK ALL OF THE  G2 DRINK AT ONE TIME.   NO SOLID FOOD AFTER 600 PM THE NIGHT BEFORE YOUR SURGERY. YOU MAY DRINK CLEAR FLUIDS. THE G2 DRINK YOU DRINK BEFORE YOU LEAVE HOME WILL BE THE LAST FLUIDS YOU DRINK BEFORE SURGERY.  PAIN IS EXPECTED AFTER SURGERY AND WILL NOT BE COMPLETELY ELIMINATED. AMBULATION AND TYLENOL  WILL HELP REDUCE INCISIONAL AND GAS PAIN. MOVEMENT IS KEY!  YOU ARE EXPECTED TO BE OUT OF BED WITHIN 4 HOURS OF ADMISSION TO YOUR PATIENT ROOM.  SITTING IN THE RECLINER THROUGHOUT THE DAY IS IMPORTANT FOR DRINKING FLUIDS AND MOVING GAS THROUGHOUT THE GI TRACT.  COMPRESSION STOCKINGS SHOULD BE WORN [facility] STAY UNLESS YOU ARE WALKING.   INCENTIVE SPIROMETER SHOULD BE USED EVERY HOUR WHILE AWAKE TO DECREASE POST-OPERATIVE COMPLICATIONS SUCH AS PNEUMONIA.  WHEN DISCHARGED HOME, IT IS IMPORTANT TO CONTINUE TO WALK EVERY HOUR  AND USE THE INCENTIVE SPIROMETER EVERY HOUR.    You may have the following liquids until 4:30 AM DAY OF SURGERY  Water  Non-Citrus Juices (without pulp, NO RED-Apple, White grape, White cranberry) Black Coffee (NO MILK/CREAM OR CREAMERS, sugar ok)  Clear Tea (NO MILK/CREAM OR CREAMERS, sugar ok) regular and decaf                             Plain Jell-O (NO RED)                                           Fruit ices (not with fruit pulp, NO RED)                                     Popsicles (NO RED)                                                               Sports drinks like Gatorade (NO RED)  The day of surgery:  Drink ONE (1) Pre-Surgery G2 at 4:30 AM the morning of surgery. Drink in one sitting. Do not sip.  This drink was given to you during your hospital  pre-op appointment visit. Nothing else to drink after completing the  Pre-Surgery G2.          If you have questions, please contact your surgeons office.   FOLLOW BOWEL PREP AND ANY ADDITIONAL PRE OP INSTRUCTIONS YOU RECEIVED FROM YOUR SURGEON'S OFFICE!!!     Oral Hygiene is also important to reduce your risk of infection.                                    Remember - BRUSH YOUR TEETH THE MORNING OF SURGERY WITH YOUR REGULAR TOOTHPASTE  DENTURES WILL BE REMOVED PRIOR TO SURGERY PLEASE DO NOT APPLY Poly grip OR ADHESIVES!!!   Stop all vitamins and herbal supplements 7 days before surgery.   Take these medicines the morning of surgery with A SIP OF WATER : None              You may not have any metal on your body including jewelry, and body piercing             Do not wear lotions, powders, cologne, or deodorant              Men may shave face and neck.   Do not bring valuables to the hospital. [facility] IS NOT             RESPONSIBLE   FOR VALUABLES.   Contacts, glasses, dentures or bridgework may not be worn into surgery.   Bring small overnight bag day of surgery.   DO NOT BRING YOUR HOME  MEDICATIONS TO THE HOSPITAL. PHARMACY WILL DISPENSE MEDICATIONS LISTED ON YOUR MEDICATION LIST TO YOU DURING YOUR ADMISSION IN THE HOSPITAL!              Please read over the following fact sheets you were given: IF YOU HAVE QUESTIONS ABOUT YOUR PRE-OP INSTRUCTIONS PLEASE CALL [phone].   If you received a COVID test during your pre-op visit  it is requested that you wear a mask when out in public, stay away from anyone that may not be feeling well and notify your surgeon if you develop symptoms. If you test positive for Covid or have been in contact with anyone that has tested positive in the last 10 days please notify you surgeon.    [facility] - Preparing for Surgery Before surgery, you can play an important role.  Because skin is not sterile, your skin needs to be as free of germs as possible.  You can reduce the number of germs on your skin by washing with CHG (chlorahexidine gluconate) soap before surgery.  CHG is an antiseptic cleaner which kills germs and bonds with the skin to continue killing germs even after washing. Please DO NOT use if you have an allergy to CHG or antibacterial soaps.  If your skin becomes reddened/irritated stop using the CHG and inform your nurse when you arrive at Short Stay. Do not shave (including legs and underarms) for at least 48 hours prior to the first CHG shower.  You may shave your face/neck.  Please follow these instructions carefully:  1.  Shower with CHG Soap the night before surgery ONLY (DO NOT USE THE SOAP THE MORNING OF SURGERY).  2.  If you choose to wash your hair, wash your hair first as usual with your normal  shampoo.  3.  After you shampoo, rinse your hair and body thoroughly to remove the shampoo.                             4.  Use CHG as you would any other liquid soap.  You can apply chg directly to the skin and wash.  Gently with a scrungie or clean washcloth.  5.  Apply the CHG Soap to your body ONLY FROM THE NECK DOWN.   Do    not use on face/ open                           Wound or open sores. Avoid contact with eyes, ears mouth and   genitals (private parts).                       Wash face,  Genitals (private parts) with your normal soap.             6.  Wash thoroughly, paying special attention to the area where your    surgery  will be performed.  7.  Thoroughly rinse your body with warm water  from the neck down.  8.  DO NOT shower/wash with your normal soap after using and rinsing off the CHG Soap.                9.  Pat yourself dry with a clean towel.            10.  Wear clean pajamas.            11.  Place clean sheets on your bed the night of your first shower and do not  sleep with pets. Day of Surgery : Do not apply any CHG, lotions/deodorants the morning of surgery.  Please wear clean clothes to the hospital/surgery center.  FAILURE TO FOLLOW THESE INSTRUCTIONS MAY RESULT IN THE CANCELLATION OF YOUR SURGERY  PATIENT SIGNATURE_________________________________  NURSE SIGNATURE__________________________________  ________________________________________________________________________ WHAT IS A BLOOD TRANSFUSION? Blood Transfusion Information  A transfusion is the replacement of blood or some of its parts. Blood is made up of multiple cells which provide different functions. Red blood cells carry oxygen and are used for blood loss replacement. White blood cells fight against infection. Platelets control bleeding. Plasma helps clot blood. Other blood products are available for specialized needs, such as hemophilia or other clotting disorders. BEFORE THE TRANSFUSION  Who gives blood for transfusions?  Healthy volunteers who are fully evaluated to make sure their blood is safe. This is blood bank blood. Transfusion therapy is the safest it has ever been in the practice of medicine. Before blood is taken from a donor, a complete history is taken to make sure that person has no history of diseases nor  engages in risky social behavior (examples are intravenous drug use or sexual activity with multiple partners). The donor's travel history is screened to minimize risk of transmitting infections, such as malaria. The donated blood is tested for signs of infectious diseases, such as HIV and hepatitis. The blood is then tested to be sure it is compatible with you in order to minimize the chance of a transfusion reaction. If you or a relative donates blood, this is often done in anticipation of surgery and is not  appropriate for emergency situations. It takes many days to process the donated blood. RISKS AND COMPLICATIONS Although transfusion therapy is very safe and saves many lives, the main dangers of transfusion include:  Getting an infectious disease. Developing a transfusion reaction. This is an allergic reaction to something in the blood you were given. Every precaution is taken to prevent this. The decision to have a blood transfusion has been considered carefully by your caregiver before blood is given. Blood is not given unless the benefits outweigh the risks. AFTER THE TRANSFUSION Right after receiving a blood transfusion, you will usually feel much better and more energetic. This is especially true if your red blood cells have gotten low (anemic). The transfusion raises the level of the red blood cells which carry oxygen, and this usually causes an energy increase. The nurse administering the transfusion will monitor you carefully for complications. HOME CARE INSTRUCTIONS  No special instructions are needed after a transfusion. You may find your energy is better. Speak with your caregiver about any limitations on activity for underlying diseases you may have. SEEK MEDICAL CARE IF:  Your condition is not improving after your transfusion. You develop redness or irritation at the intravenous (IV) site. SEEK IMMEDIATE MEDICAL CARE IF:  Any of the following symptoms occur over the next 12  hours: Shaking chills. You have a temperature by mouth above 102 F (38.9 C), not controlled by medicine. Chest, back, or muscle pain. People around you feel you are not acting correctly or are confused. Shortness of breath or difficulty breathing. Dizziness and fainting. You get a rash or develop hives. You have a decrease in urine output. Your urine turns a dark color or changes to pink, red, or brown. Any of the following symptoms occur over the next 10 days: You have a temperature by mouth above 102 F (38.9 C), not controlled by medicine. Shortness of breath. Weakness after normal activity. The white part of the eye turns yellow (jaundice). You have a decrease in the amount of urine or are urinating less often. Your urine turns a dark color or changes to pink, red, or brown. Document Released: 12/27/1999 Document Revised: 03/23/2011 Document Reviewed: 08/15/2007 [employer] Patient Information 2014 [place], [place].  _______________________________________________________________________  Incentive Spirometer  An incentive spirometer is a tool that can help keep your lungs clear and active. This tool measures how well you are filling your lungs with each breath. Taking long deep breaths may help reverse or decrease the chance of developing breathing (pulmonary) problems (especially infection) following: A long period of time when you are unable to move or be active. BEFORE THE PROCEDURE  If the spirometer includes an indicator to show your best effort, your nurse or respiratory therapist will set it to a desired goal. If possible, sit up straight or lean slightly forward. Try not to slouch. Hold the incentive spirometer in an upright position. INSTRUCTIONS FOR USE  Sit on the edge of your bed if possible, or sit up as far as you can in bed or on a chair. Hold the incentive spirometer in an upright position. Breathe out normally. Place the mouthpiece in your mouth and seal your  lips tightly around it. Breathe in slowly and as deeply as possible, raising the piston or the ball toward the top of the column. Hold your breath for 3-5 seconds or for as long as possible. Allow the piston or ball to fall to the bottom of the column. Remove the mouthpiece from your mouth and breathe  out normally. Rest for a few seconds and repeat Steps 1 through 7 at least 10 times every 1-2 hours when you are awake. Take your time and take a few normal breaths between deep breaths. The spirometer may include an indicator to show your best effort. Use the indicator as a goal to work toward during each repetition. After each set of 10 deep breaths, practice coughing to be sure your lungs are clear. If you have an incision (the cut made at the time of surgery), support your incision when coughing by placing a pillow or rolled up towels firmly against it. Once you are able to get out of bed, walk around indoors and cough well. You may stop using the incentive spirometer when instructed by your caregiver.  RISKS AND COMPLICATIONS Take your time so you do not get dizzy or light-headed. If you are in pain, you may need to take or ask for pain medication before doing incentive spirometry. It is harder to take a deep breath if you are having pain. AFTER USE Rest and breathe slowly and easily. It can be helpful to keep track of a log of your progress. Your caregiver can provide you with a simple table to help with this. If you are using the spirometer at home, follow these instructions: SEEK MEDICAL CARE IF:  You are having difficultly using the spirometer. You have trouble using the spirometer as often as instructed. Your pain medication is not giving enough relief while using the spirometer. You develop fever of 100.5 F (38.1 C) or higher. SEEK IMMEDIATE MEDICAL CARE IF:  You cough up bloody sputum that had not been present before. You develop fever of 102 F (38.9 C) or greater. You develop  worsening pain at or near the incision site. MAKE SURE YOU:  Understand these instructions. Will watch your condition. Will get help right away if you are not doing well or get worse. Document Released: 05/11/2006 Document Revised: 03/23/2011 Document Reviewed: 07/12/2006 [facility] Patient Information 2014 [place], [place].   ________________________________________________________________________

## 2024-02-02 NOTE — Progress Notes (Addendum)
 Date of COVID positive in last 90 days:  PCP - Glendia Fielding, MD Cardiologist -   Cardiac clearance by Barnie Hila, NP 11/10/23 Epic  Chest x-ray - N/A EKG - 10/20/23 Epic Stress Test - N/A ECHO - 12/08/23 Epic Cardiac Cath - N/A Pacemaker/ICD device last checked:N/A Spinal Cord Stimulator:N/A  Bowel Prep - N/A  Sleep Study - N/A CPAP -   Fasting Blood Sugar - preDM Checks Blood Sugar _____ times a day  Last dose of GLP1 agonist-  N/A GLP1 instructions:  Do not take after     Last dose of SGLT-2 inhibitors-  N/A SGLT-2 instructions:  Do not take after     Blood Thinner Instructions: N/A Last dose:   Time: Aspirin Instructions:N/A Last Dose:  Activity level:  Can go up a flight of stairs and perform activities of daily living without stopping and without symptoms of chest pain or shortness of breath.  Able to exercise without symptoms  Unable to go up a flight of stairs without symptoms of     Anesthesia review: HTN, 1st degree AV block  Patient denies shortness of breath, fever, cough and chest pain at PAT appointment  Patient verbalized understanding of instructions that were given to them at the PAT appointment. Patient was also instructed that they will need to review over the PAT instructions again at home before surgery.

## 2024-02-03 ENCOUNTER — Encounter: Payer: Self-pay | Admitting: Family Medicine

## 2024-02-03 ENCOUNTER — Other Ambulatory Visit: Payer: Self-pay | Admitting: Family Medicine

## 2024-02-03 ENCOUNTER — Encounter (HOSPITAL_COMMUNITY)
Admission: RE | Admit: 2024-02-03 | Discharge: 2024-02-03 | Disposition: A | Discharge status: Home / Self Care | Source: Ambulatory Visit | Attending: Surgery | Admitting: Surgery

## 2024-02-03 ENCOUNTER — Encounter (HOSPITAL_COMMUNITY): Payer: Self-pay

## 2024-02-03 ENCOUNTER — Other Ambulatory Visit: Payer: Self-pay

## 2024-02-03 VITALS — BP 160/95 | HR 77 | Temp 98.4°F | Resp 16 | Ht 74.0 in | Wt 336.0 lb

## 2024-02-03 DIAGNOSIS — G4733 Obstructive sleep apnea (adult) (pediatric): Secondary | ICD-10-CM | POA: Diagnosis not present

## 2024-02-03 DIAGNOSIS — Z01812 Encounter for preprocedural laboratory examination: Secondary | ICD-10-CM | POA: Diagnosis present

## 2024-02-03 DIAGNOSIS — Z6841 Body Mass Index (BMI) 40.0 and over, adult: Secondary | ICD-10-CM | POA: Insufficient documentation

## 2024-02-03 DIAGNOSIS — Z01818 Encounter for other preprocedural examination: Secondary | ICD-10-CM

## 2024-02-03 HISTORY — DX: Unspecified osteoarthritis, unspecified site: M19.90

## 2024-02-03 LAB — CBC WITH DIFFERENTIAL/PLATELET
Abs Immature Granulocytes: 0.03 K/uL (ref 0.00–0.07)
Basophils Absolute: 0 K/uL (ref 0.0–0.1)
Basophils Relative: 0 %
Eosinophils Absolute: 0.2 K/uL (ref 0.0–0.5)
Eosinophils Relative: 2 %
HCT: 48 % (ref 39.0–52.0)
Hemoglobin: 15.1 g/dL (ref 13.0–17.0)
Immature Granulocytes: 0 %
Lymphocytes Relative: 25 %
Lymphs Abs: 2.8 K/uL (ref 0.7–4.0)
MCH: 26.8 pg (ref 26.0–34.0)
MCHC: 31.5 g/dL (ref 30.0–36.0)
MCV: 85.1 fL (ref 80.0–100.0)
Monocytes Absolute: 0.8 K/uL (ref 0.1–1.0)
Monocytes Relative: 7 %
Neutro Abs: 7.1 K/uL (ref 1.7–7.7)
Neutrophils Relative %: 66 %
Platelets: 366 K/uL (ref 150–400)
RBC: 5.64 MIL/uL (ref 4.22–5.81)
RDW: 12.7 % (ref 11.5–15.5)
WBC: 11 K/uL — ABNORMAL HIGH (ref 4.0–10.5)
nRBC: 0 % (ref 0.0–0.2)

## 2024-02-03 LAB — COMPREHENSIVE METABOLIC PANEL WITH GFR
ALT: 22 U/L (ref 0–44)
AST: 24 U/L (ref 15–41)
Albumin: 4.5 g/dL (ref 3.5–5.0)
Alkaline Phosphatase: 66 U/L (ref 38–126)
Anion gap: 12 (ref 5–15)
BUN: 17 mg/dL (ref 6–20)
CO2: 23 mmol/L (ref 22–32)
Calcium: 9.6 mg/dL (ref 8.9–10.3)
Chloride: 101 mmol/L (ref 98–111)
Creatinine, Ser: 0.96 mg/dL (ref 0.61–1.24)
GFR, Estimated: >60 mL/min
Glucose, Bld: 104 mg/dL — ABNORMAL HIGH (ref 70–99)
Potassium: 4.2 mmol/L (ref 3.5–5.1)
Sodium: 136 mmol/L (ref 135–145)
Total Bilirubin: 0.5 mg/dL (ref 0.0–1.2)
Total Protein: 7.7 g/dL (ref 6.5–8.1)

## 2024-02-03 MED ORDER — AMLODIPINE BESYLATE 5 MG PO TABS: 5.0000 mg | ORAL_TABLET | Freq: Every day | ORAL | 5 refills | Status: AC

## 2024-02-03 NOTE — Progress Notes (Signed)
 Patient called and stated his PCP started him on Amlodipine  5mg  daily. Can see in MAR. Instructed patient he may take it DOS.

## 2024-02-04 ENCOUNTER — Encounter (HOSPITAL_COMMUNITY): Payer: Self-pay

## 2024-02-04 NOTE — Anesthesia Preprocedure Evaluation (Addendum)
"                                    Anesthesia Evaluation  Patient identified by MRN, date of birth, ID band Patient awake    Reviewed: Allergy & Precautions, NPO status , Patient's Chart, lab work & pertinent test results  History of Anesthesia Complications Negative for: history of anesthetic complications  Airway Mallampati: II  TM Distance: >3 FB Neck ROM: Full    Dental no notable dental hx.    Pulmonary sleep apnea    Pulmonary exam normal        Cardiovascular hypertension, Pt. on medications Normal cardiovascular exam  TTE 12/08/2023: EF 60-65%, valves ok    Neuro/Psych   Anxiety Depression       GI/Hepatic Neg liver ROS,GERD  ,,  Endo/Other    Class 3 obesity  Renal/GU negative Renal ROS     Musculoskeletal  (+) Arthritis ,    Abdominal   Peds  Hematology negative hematology ROS (+)   Anesthesia Other Findings   Reproductive/Obstetrics                              Anesthesia Physical Anesthesia Plan  ASA: 3  Anesthesia Plan: General   Post-op Pain Management: Tylenol  PO (pre-op)*, Ketamine  IV* and Precedex   Induction: Intravenous  PONV Risk Score and Plan: 3 and Treatment may vary due to age or medical condition, Ondansetron , Dexamethasone , Midazolam  and Propofol  infusion  Airway Management Planned: Oral ETT  Additional Equipment: None  Intra-op Plan:   Post-operative Plan: Extubation in OR  Informed Consent: I have reviewed the patients History and Physical, chart, labs and discussed the procedure including the risks, benefits and alternatives for the proposed anesthesia with the patient or authorized representative who has indicated his/her understanding and acceptance.     Dental advisory given  Plan Discussed with: CRNA  Anesthesia Plan Comments: (See PAT note from 1/22)         Anesthesia Quick Evaluation  "

## 2024-02-04 NOTE — Progress Notes (Signed)
 " Case: [id number] Date/Time: 02/08/24 0715   Procedures:      CREATION, GASTRIC BYPASS, ROUX-EN-Y, ROBOT-ASSISTED     ENDOSCOPY, UPPER GI TRACT   Anesthesia type: General   Pre-op diagnosis: MORBID OBESITY   Location: [facility] ROOM 02 / [facility]   Surgeons: Lyndel Deward PARAS, MD       DISCUSSION: Donald [name] is a 39 yo male with PMH of HTN, mild OSA (no CPAP), obesity (BMI 43), arthritis, anxiety.  Patient recently evaluated by cardiology for chest pain.  He underwent coronary CTA in October 2025 which showed calcium score of 0.  Echo done showed normal LVEF.  He followed up with cardiology on 12/14/2023.  He was advised to follow-up with cardiology as needed.  Also urged to follow-up with neurology for CPAP titration  Patient's blood pressure was elevated in preop.  He had recently seen his PCP on 12/2623 who advised he would start him on blood pressure medicine if it remained elevated.  Patient started on amlodipine  after preop by PCP.  VS: BP (!) 160/95   Pulse 77   Temp 36.9 C (Oral)   Resp 16   Ht 6' 2 (1.88 m)   Wt (!) 152.4 kg   SpO2 99%   BMI 43.14 kg/m   PROVIDERS: Alphonsa Glendia LABOR, MD   LABS: Labs reviewed: Acceptable for surgery. (all labs ordered are listed, but only abnormal results are displayed)  Labs Reviewed  CBC WITH DIFFERENTIAL/PLATELET - Abnormal; Notable for the following components:      Result Value   WBC 11.0 (*)    All other components within normal limits  COMPREHENSIVE METABOLIC PANEL WITH GFR - Abnormal; Notable for the following components:   Glucose, Bld 104 (*)    All other components within normal limits  TYPE AND SCREEN     CT chest overread 11/11/23:  IMPRESSION: Mild bronchial thickening with heterogeneous pulmonary parenchyma, can be seen with small airways disease.    EKG 10/20/2023:  Sinus rhythm with first-degree AV block Cannot rule out anterior infarct, age undetermined   Echo 12/08/2023:  IMPRESSIONS    1. Left  ventricular ejection fraction, by estimation, is 60 to 65%. The left ventricle has normal function. Left ventricular endocardial border not optimally defined to evaluate regional wall motion. Left ventricular diastolic parameters were normal.  2. Right ventricular systolic function is normal. The right ventricular size is not well visualized.  3. The mitral valve is normal in structure. No evidence of mitral valve regurgitation. No evidence of mitral stenosis.  4. The aortic valve was not well visualized. Aortic valve regurgitation is not visualized. No aortic stenosis is present.  5. The inferior vena cava is normal in size with greater than 50% respiratory variability, suggesting right atrial pressure of 3 mmHg.  Comparison(s): No prior Echocardiogram. Technically difficult evaluation but overall normal echocardiogram.  Coronary CTA 11/11/2023:   IMPRESSION: 1. Coronary calcium score of 0.   2. Normal coronary origin with right dominance.   3. Normal coronary arteries.   Past Medical History:  Diagnosis Date   Anxiety    Arthritis    Chronic abdominal pain    Depression    Dyspepsia    Erectile dysfunction 03/28/2019   Partly related to SSRI   HTN (hypertension) 10/19/2018   Morbid obesity (HCC) 05/10/2017   Pinched nerve    in his back    Past Surgical History:  Procedure Laterality Date   APPENDECTOMY     July 2019  ESOPHAGOGASTRODUODENOSCOPY N/A 04/02/2014   RMR: Very small hiatial hernia. Gastric polyps status post biopsy. No endoscopic explanation for patient's symptoms. Bengin fundic gland polyp. Negative H.pylori   ESOPHAGOGASTRODUODENOSCOPY N/A 12/02/2023   Procedure: EGD (ESOPHAGOGASTRODUODENOSCOPY);  Surgeon: Lyndel Deward PARAS, MD;  Location: [facility];  Service: General;  Laterality: N/A;   ESOPHAGOGASTRODUODENOSCOPY (EGD) WITH PROPOFOL  N/A 03/03/2018   Procedure: ESOPHAGOGASTRODUODENOSCOPY (EGD) WITH PROPOFOL ;  Surgeon: Shaaron Lamar HERO, MD;   [facility];  Service: Endoscopy;  Laterality: N/A;  12:30pm    MEDICATIONS:  amLODipine  (NORVASC ) 5 MG tablet   augmented betamethasone dipropionate (DIPROLENE-AF) 0.05 % cream   ibuprofen (ADVIL) 200 MG tablet   sildenafil  (VIAGRA ) 100 MG tablet   Thiamine HCl (VITAMIN B-1 PO)   No current facility-administered medications for this encounter.   Burnard HERO Odis DEVONNA [facility] Short Stay/Anesthesiology [facility] Phone [phone] 02/04/2024 10:39 AM        "

## 2024-02-08 ENCOUNTER — Ambulatory Visit (HOSPITAL_COMMUNITY): Admission: RE | Admit: 2024-02-08 | Discharge: 2024-02-09 | Disposition: A | Attending: Surgery | Admitting: Surgery

## 2024-02-08 ENCOUNTER — Ambulatory Visit (HOSPITAL_COMMUNITY): Payer: Self-pay | Admitting: Medical

## 2024-02-08 ENCOUNTER — Encounter (HOSPITAL_COMMUNITY): Payer: Self-pay | Admitting: Certified Registered"

## 2024-02-08 ENCOUNTER — Encounter (HOSPITAL_COMMUNITY): Admission: RE | Disposition: A | Payer: Self-pay | Source: Home / Self Care | Attending: Surgery

## 2024-02-08 ENCOUNTER — Encounter (HOSPITAL_COMMUNITY): Payer: Self-pay | Admitting: Surgery

## 2024-02-08 ENCOUNTER — Other Ambulatory Visit: Payer: Self-pay

## 2024-02-08 DIAGNOSIS — E66813 Obesity, class 3: Secondary | ICD-10-CM | POA: Diagnosis not present

## 2024-02-08 DIAGNOSIS — Z01818 Encounter for other preprocedural examination: Secondary | ICD-10-CM

## 2024-02-08 DIAGNOSIS — I10 Essential (primary) hypertension: Secondary | ICD-10-CM | POA: Insufficient documentation

## 2024-02-08 DIAGNOSIS — Z6841 Body Mass Index (BMI) 40.0 and over, adult: Secondary | ICD-10-CM | POA: Diagnosis not present

## 2024-02-08 DIAGNOSIS — Z79899 Other long term (current) drug therapy: Secondary | ICD-10-CM | POA: Diagnosis not present

## 2024-02-08 DIAGNOSIS — G4730 Sleep apnea, unspecified: Secondary | ICD-10-CM | POA: Insufficient documentation

## 2024-02-08 DIAGNOSIS — M1990 Unspecified osteoarthritis, unspecified site: Secondary | ICD-10-CM | POA: Insufficient documentation

## 2024-02-08 LAB — CBC
HCT: 44.8 % (ref 39.0–52.0)
Hemoglobin: 15.1 g/dL (ref 13.0–17.0)
MCH: 27.9 pg (ref 26.0–34.0)
MCHC: 33.7 g/dL (ref 30.0–36.0)
MCV: 82.8 fL (ref 80.0–100.0)
Platelets: 313 10*3/uL (ref 150–400)
RBC: 5.41 MIL/uL (ref 4.22–5.81)
RDW: 12.8 % (ref 11.5–15.5)
WBC: 16.5 10*3/uL — ABNORMAL HIGH (ref 4.0–10.5)
nRBC: 0 % (ref 0.0–0.2)

## 2024-02-08 LAB — TYPE AND SCREEN
ABO/RH(D): A POS
Antibody Screen: NEGATIVE

## 2024-02-08 LAB — CREATININE, SERUM
Creatinine, Ser: 0.98 mg/dL (ref 0.61–1.24)
GFR, Estimated: >60 mL/min

## 2024-02-08 LAB — ABO/RH: ABO/RH(D): A POS

## 2024-02-08 LAB — HEMOGLOBIN AND HEMATOCRIT, BLOOD
HCT: 44.3 % (ref 39.0–52.0)
Hemoglobin: 14.9 g/dL (ref 13.0–17.0)

## 2024-02-08 MED ORDER — FENTANYL CITRATE (PF) 100 MCG/2ML IJ SOLN
INTRAMUSCULAR | Status: AC
  Filled 2024-02-08: qty 2

## 2024-02-08 MED ORDER — GLYCOPYRROLATE 0.2 MG/ML IJ SOLN
INTRAMUSCULAR | Status: DC | PRN
  Administered 2024-02-08: .2 mg via INTRAVENOUS

## 2024-02-08 MED ORDER — AMLODIPINE BESYLATE 5 MG PO TABS
5.0000 mg | ORAL_TABLET | Freq: Every day | ORAL | Status: DC
  Administered 2024-02-09: 5 mg via ORAL
  Filled 2024-02-08: qty 1

## 2024-02-08 MED ORDER — ORAL CARE MOUTH RINSE
15.0000 mL | Freq: Once | OROMUCOSAL | Status: AC
  Administered 2024-02-08: 15 mL via OROMUCOSAL

## 2024-02-08 MED ORDER — LACTATED RINGERS IV SOLN: INTRAVENOUS | Status: DC

## 2024-02-08 MED ORDER — MORPHINE SULFATE (PF) 2 MG/ML IV SOLN: 1.0000 mg | INTRAVENOUS | Status: DC | PRN

## 2024-02-08 MED ORDER — PHENYLEPHRINE 80 MCG/ML (10ML) SYRINGE FOR IV PUSH (FOR BLOOD PRESSURE SUPPORT)
PREFILLED_SYRINGE | INTRAVENOUS | Status: AC
  Filled 2024-02-08: qty 10

## 2024-02-08 MED ORDER — FENTANYL CITRATE (PF) 100 MCG/2ML IJ SOLN
INTRAMUSCULAR | Status: DC | PRN
  Administered 2024-02-08: 100 ug via INTRAVENOUS

## 2024-02-08 MED ORDER — ROCURONIUM BROMIDE 10 MG/ML (PF) SYRINGE
PREFILLED_SYRINGE | INTRAVENOUS | Status: AC
  Filled 2024-02-08: qty 10

## 2024-02-08 MED ORDER — DEXAMETHASONE SOD PHOSPHATE PF 10 MG/ML IJ SOLN
INTRAMUSCULAR | Status: DC | PRN
  Administered 2024-02-08: 10 mg via INTRAVENOUS

## 2024-02-08 MED ORDER — KETAMINE HCL 50 MG/5ML IJ SOSY
PREFILLED_SYRINGE | INTRAMUSCULAR | Status: AC
  Filled 2024-02-08: qty 5

## 2024-02-08 MED ORDER — EPHEDRINE 5 MG/ML INJ
INTRAVENOUS | Status: AC
  Filled 2024-02-08: qty 5

## 2024-02-08 MED ORDER — FENTANYL CITRATE (PF) 50 MCG/ML IJ SOSY
PREFILLED_SYRINGE | INTRAMUSCULAR | Status: AC
  Filled 2024-02-08: qty 2

## 2024-02-08 MED ORDER — OXYCODONE HCL 5 MG PO TABS: 5.0000 mg | ORAL_TABLET | Freq: Once | ORAL | Status: DC | PRN

## 2024-02-08 MED ORDER — MIDAZOLAM HCL 2 MG/2ML IJ SOLN
INTRAMUSCULAR | Status: AC
  Filled 2024-02-08: qty 2

## 2024-02-08 MED ORDER — ATROPINE SULFATE 0.4 MG/ML IV SOLN
INTRAVENOUS | Status: DC | PRN
  Administered 2024-02-08 (×2): .2 mg via INTRAVENOUS

## 2024-02-08 MED ORDER — FENTANYL CITRATE (PF) 50 MCG/ML IJ SOSY
PREFILLED_SYRINGE | INTRAMUSCULAR | Status: AC
  Filled 2024-02-08: qty 1

## 2024-02-08 MED ORDER — PHENYLEPHRINE 80 MCG/ML (10ML) SYRINGE FOR IV PUSH (FOR BLOOD PRESSURE SUPPORT)
PREFILLED_SYRINGE | INTRAVENOUS | Status: DC | PRN
  Administered 2024-02-08 (×2): 120 ug via INTRAVENOUS

## 2024-02-08 MED ORDER — CHLORHEXIDINE GLUCONATE CLOTH 2 % EX PADS: 6.0000 | MEDICATED_PAD | Freq: Once | CUTANEOUS | Status: DC

## 2024-02-08 MED ORDER — PROPOFOL 10 MG/ML IV BOLUS
INTRAVENOUS | Status: AC
  Filled 2024-02-08: qty 20

## 2024-02-08 MED ORDER — HEPARIN SODIUM (PORCINE) 5000 UNIT/ML IJ SOLN
5000.0000 [IU] | INTRAMUSCULAR | Status: AC
  Administered 2024-02-08: 5000 [IU] via SUBCUTANEOUS
  Filled 2024-02-08: qty 1

## 2024-02-08 MED ORDER — APREPITANT 40 MG PO CAPS
40.0000 mg | ORAL_CAPSULE | ORAL | Status: AC
  Administered 2024-02-08: 40 mg via ORAL
  Filled 2024-02-08: qty 1

## 2024-02-08 MED ORDER — ONDANSETRON HCL 4 MG/2ML IJ SOLN
INTRAMUSCULAR | Status: DC | PRN
  Administered 2024-02-08: 4 mg via INTRAVENOUS

## 2024-02-08 MED ORDER — DEXAMETHASONE SOD PHOSPHATE PF 10 MG/ML IJ SOLN
INTRAMUSCULAR | Status: AC
  Filled 2024-02-08: qty 1

## 2024-02-08 MED ORDER — STERILE WATER FOR IRRIGATION IR SOLN
Status: DC | PRN
  Administered 2024-02-08: 2000 mL

## 2024-02-08 MED ORDER — PROPOFOL 500 MG/50ML IV EMUL
INTRAVENOUS | Status: AC
  Filled 2024-02-08: qty 50

## 2024-02-08 MED ORDER — SODIUM CHLORIDE 0.9 % IV SOLN
2.0000 g | INTRAVENOUS | Status: AC
  Administered 2024-02-08: 2 g via INTRAVENOUS
  Filled 2024-02-08: qty 2

## 2024-02-08 MED ORDER — FENTANYL CITRATE (PF) 50 MCG/ML IJ SOSY
25.0000 ug | PREFILLED_SYRINGE | INTRAMUSCULAR | Status: DC | PRN
  Administered 2024-02-08: 50 ug via INTRAVENOUS
  Administered 2024-02-08 (×2): 25 ug via INTRAVENOUS
  Administered 2024-02-08: 50 ug via INTRAVENOUS

## 2024-02-08 MED ORDER — PROPOFOL 1000 MG/100ML IV EMUL
INTRAVENOUS | Status: AC
  Filled 2024-02-08: qty 100

## 2024-02-08 MED ORDER — OXYCODONE HCL 5 MG/5ML PO SOLN
5.0000 mg | Freq: Four times a day (QID) | ORAL | Status: DC | PRN
  Administered 2024-02-08 – 2024-02-09 (×3): 5 mg via ORAL
  Filled 2024-02-08 (×3): qty 5

## 2024-02-08 MED ORDER — ACETAMINOPHEN 500 MG PO TABS
1000.0000 mg | ORAL_TABLET | ORAL | Status: AC
  Administered 2024-02-08: 1000 mg via ORAL
  Filled 2024-02-08: qty 2

## 2024-02-08 MED ORDER — PROPOFOL 10 MG/ML IV BOLUS
INTRAVENOUS | Status: DC | PRN
  Administered 2024-02-08: 200 mg via INTRAVENOUS

## 2024-02-08 MED ORDER — PHENYLEPHRINE HCL-NACL 20-0.9 MG/250ML-% IV SOLN
INTRAVENOUS | Status: DC | PRN
  Administered 2024-02-08: 30 ug/min via INTRAVENOUS

## 2024-02-08 MED ORDER — OXYCODONE HCL 5 MG/5ML PO SOLN: 5.0000 mg | Freq: Once | ORAL | Status: DC | PRN

## 2024-02-08 MED ORDER — BUPIVACAINE-EPINEPHRINE 0.25% -1:200000 IJ SOLN
INTRAMUSCULAR | Status: DC | PRN
  Administered 2024-02-08: 60 mL

## 2024-02-08 MED ORDER — HEPARIN SODIUM (PORCINE) 5000 UNIT/ML IJ SOLN
5000.0000 [IU] | Freq: Three times a day (TID) | INTRAMUSCULAR | Status: DC
  Administered 2024-02-08 – 2024-02-09 (×2): 5000 [IU] via SUBCUTANEOUS
  Filled 2024-02-08 (×2): qty 1

## 2024-02-08 MED ORDER — ACETAMINOPHEN 160 MG/5ML PO SOLN: 1000.0000 mg | Freq: Three times a day (TID) | ORAL | Status: DC

## 2024-02-08 MED ORDER — DROPERIDOL 2.5 MG/ML IJ SOLN: 0.6250 mg | Freq: Once | INTRAMUSCULAR | Status: DC | PRN

## 2024-02-08 MED ORDER — ROCURONIUM BROMIDE 10 MG/ML (PF) SYRINGE
PREFILLED_SYRINGE | INTRAVENOUS | Status: DC | PRN
  Administered 2024-02-08: 100 mg via INTRAVENOUS
  Administered 2024-02-08 (×2): 10 mg via INTRAVENOUS
  Administered 2024-02-08 (×2): 20 mg via INTRAVENOUS

## 2024-02-08 MED ORDER — SUGAMMADEX SODIUM 200 MG/2ML IV SOLN
INTRAVENOUS | Status: AC
  Filled 2024-02-08: qty 4

## 2024-02-08 MED ORDER — PROPOFOL 500 MG/50ML IV EMUL
INTRAVENOUS | Status: DC | PRN
  Administered 2024-02-08: 50 ug/kg/min via INTRAVENOUS

## 2024-02-08 MED ORDER — KETAMINE HCL 50 MG/5ML IJ SOSY
PREFILLED_SYRINGE | INTRAMUSCULAR | Status: DC | PRN
  Administered 2024-02-08: 10 mg via INTRAVENOUS
  Administered 2024-02-08: 30 mg via INTRAVENOUS
  Administered 2024-02-08: 10 mg via INTRAVENOUS

## 2024-02-08 MED ORDER — ENSURE MAX PROTEIN PO LIQD
2.0000 [oz_av] | ORAL | Status: DC
  Administered 2024-02-09 (×2): 2 [oz_av] via ORAL

## 2024-02-08 MED ORDER — MIDAZOLAM HCL (PF) 2 MG/2ML IJ SOLN
INTRAMUSCULAR | Status: DC | PRN
  Administered 2024-02-08: 2 mg via INTRAVENOUS

## 2024-02-08 MED ORDER — LIDOCAINE HCL (CARDIAC) PF 100 MG/5ML IV SOSY: PREFILLED_SYRINGE | INTRAVENOUS | Status: DC | PRN

## 2024-02-08 MED ORDER — EPINEPHRINE 1 MG/10ML IV SOSY
PREFILLED_SYRINGE | INTRAVENOUS | Status: AC
  Filled 2024-02-08: qty 10

## 2024-02-08 MED ORDER — GLYCOPYRROLATE 0.2 MG/ML IJ SOLN
INTRAMUSCULAR | Status: AC
  Filled 2024-02-08: qty 1

## 2024-02-08 MED ORDER — BUPIVACAINE-EPINEPHRINE (PF) 0.25% -1:200000 IJ SOLN
INTRAMUSCULAR | Status: AC
  Filled 2024-02-08: qty 60

## 2024-02-08 MED ORDER — 0.9 % SODIUM CHLORIDE (POUR BTL) OPTIME
TOPICAL | Status: DC | PRN
  Administered 2024-02-08: 1000 mL

## 2024-02-08 MED ORDER — ATROPINE SULFATE (PF) 0.4 MG/ML IJ SOLN
INTRAMUSCULAR | Status: AC
  Filled 2024-02-08: qty 1

## 2024-02-08 MED ORDER — ONDANSETRON HCL 4 MG/2ML IJ SOLN: 4.0000 mg | INTRAMUSCULAR | Status: DC | PRN

## 2024-02-08 MED ORDER — ONDANSETRON HCL 4 MG/2ML IJ SOLN
INTRAMUSCULAR | Status: AC
  Filled 2024-02-08: qty 2

## 2024-02-08 MED ORDER — PANTOPRAZOLE SODIUM 40 MG IV SOLR
40.0000 mg | Freq: Every day | INTRAVENOUS | Status: DC
  Administered 2024-02-08: 40 mg via INTRAVENOUS
  Filled 2024-02-08: qty 10

## 2024-02-08 MED ORDER — LIDOCAINE HCL (CARDIAC) PF 100 MG/5ML IV SOSY
PREFILLED_SYRINGE | INTRAVENOUS | Status: DC | PRN
  Administered 2024-02-08: 100 mg via INTRAVENOUS

## 2024-02-08 MED ORDER — ACETAMINOPHEN 500 MG PO TABS
1000.0000 mg | ORAL_TABLET | Freq: Three times a day (TID) | ORAL | Status: DC
  Administered 2024-02-08 – 2024-02-09 (×3): 1000 mg via ORAL
  Filled 2024-02-08 (×3): qty 2

## 2024-02-08 MED ORDER — CHLORHEXIDINE GLUCONATE 0.12 % MT SOLN: 15.0000 mL | Freq: Once | OROMUCOSAL | Status: AC

## 2024-02-08 MED ORDER — SUGAMMADEX SODIUM 200 MG/2ML IV SOLN
INTRAVENOUS | Status: DC | PRN
  Administered 2024-02-08: 300 mg via INTRAVENOUS

## 2024-02-08 MED ORDER — SIMETHICONE 80 MG PO CHEW
80.0000 mg | CHEWABLE_TABLET | Freq: Four times a day (QID) | ORAL | Status: DC | PRN
  Administered 2024-02-09: 80 mg via ORAL
  Filled 2024-02-08: qty 1

## 2024-02-08 NOTE — Progress Notes (Signed)
 PHARMACY CONSULT FOR:  Risk Assessment for Post-Discharge VTE Following Bariatric Surgery  Procedure* Roux-en-y Gastric bypass  Sex M  Black race N  Age (years) 38  BMI (kg/m2) 42  Operation duration (minutes)  History of VTE requiring treatment* N  Hypercoagulable condition* N  Liver disorder* N  Pre-op venous stasis N  Pre-op functional health status Independent   Previous foregut or bariatric surg N  Post-op surgical site infection N  Transfusion intra- or post-op* N  Unplanned readmission N  Unplanned reoperation N  GI perforation/leak/obstruction* N  *specific risk factors for portomesenteric venous thrombosis   Predicted probability of 30-day post-discharge VTE:    0.2907 % estimated using the [facility] Calculator  Other patient-specific factors to consider: None  Recommendation for Discharge: No pharmacologic prophylaxis post-discharge   Donald Donald [name] is a 39 y.o. male who underwent  Roux-en-Y Gastric bypass on 02/08/24   Case start: 0805 Case end: 1010   Allergies[1]  Patient Measurements: Weight: (!) 148.8 kg (328 lb) Body mass index is 42.11 kg/m.  No results for input(s): WBC, HGB, HCT, PLT, APTT, CREATININE, LABCREA, CREAT24HRUR, MG, PHOS, ALBUMIN, PROT, AST, ALT, ALKPHOS, BILITOT, BILIDIR, IBILI in the last 72 hours. Estimated Creatinine Clearance: 157.4 mL/min (by C-G formula based on SCr of 0.96 mg/dL).    Past Medical History:  Diagnosis Date   Anxiety    Arthritis    Chronic abdominal pain    Depression    Dyspepsia    Erectile dysfunction 03/28/2019   Partly related to SSRI   HTN (hypertension) 10/19/2018   Morbid obesity (HCC) 05/10/2017   Pinched nerve    in his back     Medications Prior to Admission  Medication Sig Dispense Refill Last Dose/Taking   amLODipine  (NORVASC ) 5 MG tablet Take 1 tablet (5 mg total) by mouth daily. 30 tablet 5 02/08/2024    augmented betamethasone dipropionate (DIPROLENE-AF) 0.05 % cream Apply 1 application topically 2 (two) times daily as needed (for psoriasis).    Taking As Needed   Thiamine HCl (VITAMIN B-1 PO) Take 1 tablet by mouth at bedtime.   02/06/2024   ibuprofen (ADVIL) 200 MG tablet Take 400 mg by mouth every 8 (eight) hours as needed (pain.).   More than a month   sildenafil  (VIAGRA ) 100 MG tablet TAKE 1/2 TO ONE TABLET BY MOUTH EVERY DAY AS NEEDED 5 tablet 5 More than a month     Donald Donald [name], PharmD, BCPS Clinical Staff Pharmacist  Donald [name], Donald Donald [name] 02/08/2024,11:01 AM     [1] No Known Allergies

## 2024-02-08 NOTE — Plan of Care (Signed)
  Problem: Health Behavior/Discharge Planning: Goal: Ability to manage health-related needs will improve Outcome: Progressing   Problem: Activity: Goal: Risk for activity intolerance will decrease Outcome: Progressing   Problem: Nutrition: Goal: Adequate nutrition will be maintained Outcome: Progressing   

## 2024-02-08 NOTE — Anesthesia Procedure Notes (Signed)
 Procedure Name: Intubation Date/Time: 02/08/2024 7:43 AM  Performed by: Metta Andrea NOVAK, CRNAPre-anesthesia Checklist: Patient identified, Emergency Drugs available, Suction available, Patient being monitored and Timeout performed Patient Re-evaluated:Patient Re-evaluated prior to induction Oxygen Delivery Method: Circle system utilized Preoxygenation: Pre-oxygenation with 100% oxygen Induction Type: IV induction Ventilation: Mask ventilation without difficulty Laryngoscope Size: Mac and 4 Grade View: Grade I Tube type: Oral Tube size: 7.5 mm Number of attempts: 1 Airway Equipment and Method: Stylet Placement Confirmation: ETT inserted through vocal cords under direct vision, positive ETCO2 and breath sounds checked- equal and bilateral Secured at: 23 cm Tube secured with: Tape Dental Injury: Teeth and Oropharynx as per pre-operative assessment

## 2024-02-08 NOTE — Anesthesia Postprocedure Evaluation (Signed)
"   Anesthesia Post Note  Patient: Donald [name]  Procedure(s) Performed: CREATION, GASTRIC BYPASS, ROUX-EN-Y, ROBOT-ASSISTED ENDOSCOPY, UPPER GI TRACT     Patient location during evaluation: PACU Anesthesia Type: General Level of consciousness: awake and alert Pain management: pain level controlled Vital Signs Assessment: post-procedure vital signs reviewed and stable Respiratory status: spontaneous breathing, nonlabored ventilation and respiratory function stable Cardiovascular status: blood pressure returned to baseline Postop Assessment: no apparent nausea or vomiting Anesthetic complications: no   No notable events documented.  Last Vitals:  Vitals:   02/08/24 1115 02/08/24 1130  BP: (!) 153/93 (!) 145/84  Pulse: 88 80  Resp: 18 12  Temp:    SpO2: 97% 98%    Last Pain:  Vitals:   02/08/24 1106  TempSrc:   PainSc: 5                  Vertell Row      "

## 2024-02-08 NOTE — Progress Notes (Signed)
 Patient seen in room 1312, visitor at bedside.  Discussed QI Goals for Discharge document with patient including ambulation in halls, Incentive Spirometry use every hour, and oral care.  Also discussed pain and nausea control.  Enabled or verified head of bed 30 degree alarm activated.  BSTOP education provided including BSTOP information guide, Guide for Pain Management after your Bariatric Procedure.  Diet progression education provided including Bariatric Surgery Post-Op Food Plan Phase 1: Liquids.  Patient stated that he had drank water  in pacu. Questions answered.  Will continue to partner with bedside RN and follow up with patient per protocol.    Thank you,  Roseann Medley, RN, MSN Bariatric Nurse Coordinator [phone] (office)

## 2024-02-08 NOTE — H&P (Signed)
 "  Admitting Physician: Deward PARAS Marvell Stavola  Service: General Surgery  CC: Obesity  Subjective   HPI: Donald [name] is an 39 y.o. male who is here for robotic gastric bypass.  Past Medical History:  Diagnosis Date   Anxiety    Arthritis    Chronic abdominal pain    Depression    Dyspepsia    Erectile dysfunction 03/28/2019   Partly related to SSRI   HTN (hypertension) 10/19/2018   Morbid obesity (HCC) 05/10/2017   Pinched nerve    in his back    Past Surgical History:  Procedure Laterality Date   APPENDECTOMY     July 2019   ESOPHAGOGASTRODUODENOSCOPY N/A 04/02/2014   RMR: Very small hiatial hernia. Gastric polyps status post biopsy. No endoscopic explanation for patient's symptoms. Bengin fundic gland polyp. Negative H.pylori   ESOPHAGOGASTRODUODENOSCOPY N/A 12/02/2023   Procedure: EGD (ESOPHAGOGASTRODUODENOSCOPY);  Surgeon: Lyndel Deward PARAS, MD;  Location: [facility];  Service: General;  Laterality: N/A;   ESOPHAGOGASTRODUODENOSCOPY (EGD) WITH PROPOFOL  N/A 03/03/2018   Procedure: ESOPHAGOGASTRODUODENOSCOPY (EGD) WITH PROPOFOL ;  Surgeon: Shaaron Lamar CHRISTELLA, MD;  Location[facility];  Service: Endoscopy;  Laterality: N/A;  12:30pm    Family History  Problem Relation Age of Onset   Diabetes Father    Hypertension Father    Colon cancer Neg Hx    Stomach cancer Neg Hx    Colon polyps Neg Hx     Social:  reports that he has never smoked. He has never used smokeless tobacco. He reports that he does not currently use alcohol. He reports that he does not use drugs.  Allergies: Allergies[1]  Medications: Current Outpatient Medications  Medication Instructions   amLODipine  (NORVASC ) 5 mg, Oral, Daily   augmented betamethasone dipropionate (DIPROLENE-AF) 0.05 % cream 1 application , 2 times daily PRN   ibuprofen (ADVIL) 400 mg, Every 8 hours PRN   sildenafil  (VIAGRA ) 100 MG tablet TAKE 1/2 TO ONE TABLET BY MOUTH EVERY DAY AS NEEDED   Thiamine HCl (VITAMIN B-1  PO) 1 tablet, Daily at bedtime    ROS - all of the below systems have been reviewed with the patient and positives are indicated with bold text General: chills, fever or night sweats Eyes: blurry vision or double vision ENT: epistaxis or sore throat Allergy/Immunology: itchy/watery eyes or nasal congestion Hematologic/Lymphatic: bleeding problems, blood clots or swollen lymph nodes Endocrine: temperature intolerance or unexpected weight changes Breast: new or changing breast lumps or nipple discharge Resp: cough, shortness of breath, or wheezing CV: chest pain or dyspnea on exertion GI: as per HPI GU: dysuria, trouble voiding, or hematuria MSK: joint pain or joint stiffness Neuro: TIA or stroke symptoms Derm: pruritus and skin lesion changes Psych: anxiety and depression  Objective   PE Blood pressure (!) 176/90, pulse 88, temperature 98.3 F (36.8 C), temperature source Oral, resp. rate 16, weight (!) 148.8 kg, SpO2 99%. Constitutional: NAD; conversant; no deformities Eyes: Moist conjunctiva; no lid lag; anicteric; PERRL Neck: Trachea midline; no thyromegaly Lungs: Normal respiratory effort; no tactile fremitus CV: RRR; no palpable thrills; no pitting edema GI: Abd Soft, nontender; no palpable hepatosplenomegaly MSK: Normal range of motion of extremities; no clubbing/cyanosis Psychiatric: Appropriate affect; alert and oriented x3 Lymphatic: No palpable cervical or axillary lymphadenopathy  Results for orders placed or performed during the hospital encounter of 02/08/24 (from the past 24 hours)  ABO/Rh     Status: None   Collection Time: 02/08/24  6:04 AM  Result Value Ref  Range   ABO/RH(D)      A POS Performed at [facility], [address], [place], [place] [address]      Imaging Orders         DG Chest 2 View per protocol      Assessment and Plan   Donald [name] is an 39 y.o. male with obesity, BMI 42, here for robotic gastric bypass.  We  discussed the procedure, its risks, benefits and alternatives on multiple occasions.  He completed the preoperative pathway.  He grants consent to proceed.    Deward JINNY Foy, MD  [facility] Use [email or web address] to contact on call provider       [1] No Known Allergies  "

## 2024-02-08 NOTE — Transfer of Care (Signed)
 Immediate Anesthesia Transfer of Care Note  Patient: Donald [name]  Procedure(s) Performed: CREATION, GASTRIC BYPASS, ROUX-EN-Y, ROBOT-ASSISTED ENDOSCOPY, UPPER GI TRACT  Patient Location: PACU  Anesthesia Type:General  Level of Consciousness: awake, drowsy, and patient cooperative  Airway & Oxygen Therapy: Patient Spontanous Breathing and Patient connected to face mask oxygen  Post-op Assessment: Report given to RN and Post -op Vital signs reviewed and stable  Post vital signs: Reviewed and stable  Last Vitals:  Vitals Value Taken Time  BP 149/86 02/08/24 10:25  Temp    Pulse 90 02/08/24 10:26  Resp 19 02/08/24 10:26  SpO2 100 % 02/08/24 10:26  Vitals shown include unfiled device data.  Last Pain:  Vitals:   02/08/24 0551  TempSrc:   PainSc: 0-No pain         Complications: No notable events documented.

## 2024-02-08 NOTE — Op Note (Signed)
 "  Patient: Donald [name] (1985-03-08, [id number])  Date of Surgery: 02/08/2024  Preoperative Diagnosis: MORBID OBESITY   Postoperative Diagnosis: MORBID OBESITY   Surgical Procedure: CREATION, GASTRIC BYPASS, ROUX-EN-Y, ROBOT-ASSISTED: 56355 (CPT) S2900  ENDOSCOPY, UPPER GI TRACT: 56764 (CPT)    Operative Team Members:  Surgeons and Role:    * Lakysha Kossman, Deward PARAS, MD - Primary    * Kinsinger, Herlene Righter, MD - Assisting   Anesthesiologist: Jerene Yeager Lamarr BRAVO, MD CRNA: Judythe Tanda Aran, CRNA; Metta Andrea NOVAK, CRNA   Anesthesia: General   Fluids:  Total I/O In: 1500 [I.V.:1400; IV Piggyback:100] Out: 30 [Blood:30]  Complications: * No complications entered in OR log *  Drains:  none   Specimen: * No specimens in log *   Disposition:  PACU - hemodynamically stable.  Plan of Care: Admit for overnight observation    Indications for Procedure: Donald [name] is a 39 y.o. male who presented with obesity, BMI 42, here for robotic gastric bypass. We discussed the procedure, its risks, benefits and alternatives on multiple occasions. He completed the preoperative pathway. He grants consent to proceed.   Findings: Normal anatomy, no hiatal hernia  Infection status: Patient: Private Patient Elective Case Case: Elective Infection Present At Time Of Surgery (PATOS): Some spillage of foregut  and jejunal contents while creating anastomoses   Description of Procedure:   On the date stated above, the patient was taken to the operating room suite and placed in supine positioning.  General endotracheal anesthesia was induced.  A timeout was completed verifying the correct patient, procedure, positioning and equipment needed for the case.  The patient's abdomen was prepped and draped in the usual sterile fashion.  A 5 mm trocar was used to enter the right upper quadrant using optical technique.  The abdomen was entered safely without any trauma the underlying viscera.  Three  additional incisions were made and 4 robotic trochars were placed across the abdomen, replacing the 5 mm trocar with the 12 mm robotic stapler trocar.  The [facility] liver retractor was placed through the subxiphoid region and under the left lobe of the liver and was connected to the rail of the bed.  A TAP block was placed using marcaine  under direct vision of the laparoscope.  The [employer] XI robotic platform was docked and we transitioned to robotic surgery.  Using the tip up grasper, fenestrated bipolar, 30 degree camera and Vessel Sealer from the patient's right to left, we began by dissecting the angle of His off the left crus of the diaphragm.  The adhesions between the stomach, spleen and diaphragm were divided using the Vessel Sealer to define the angle of His.  I then started 4-6 cm down on the lesser curve of the stomach and created a defect in the gastrohepatic ligament tracking behind the lesser curve of the stomach to enter the lesser sac.  I then used multiple white loads of the robotic 60 mm Sureform linear stapler to form the gastric pouch.  I then directed my attention to the lower abdomen.  The omentum was divided with the Vessel Sealer and I identified the ligament of treitz.  The jejunum was run to a point 50 cm from the ligament of Treitz.  This loop of bowel was then brought into the left upper quadrant, over the transverse colon, between the split omentum.  A 2-0 v-loc suture was used to create the posterior outer row of the gastrojejunal anastomosis.  An approximately 2 cm gastrotomy  was made in the pouch and a matching 2 cm enterotomy was created in the roux limb.  Then, two 3-0 v-loc sutures were used to create a posterior, inner, full thickness layer of the anastomosis.  While sewing the anterior inner layer, the Ewald tube was passed through the anastomosis to ensure patency.  The outer, anterior layer was then created using 2-0 v-loc suture.  A window was made in the jejunal mesentery  and the jejunum was divided just proximal to the gastrojejunal anastomosis using a white load of the robotic 60 mm Sureform linear stapler to divide the roux limb from the hepatobiliary limb.  At this point the gastrojejunal anastomosis was complete and the Ewald tube was removed.  The roux limb was then run 100 cm from the gastrojejunal anastomosis.  This loop of bowel was brought into the left upper quadrant for anastomosis to the hepatobiliary limb.  A robotic 60 mm white load on the Sureform linear stapler was introduced into both limbs and fired to create the jejunojejunostomy.  The common enterotomy was closed using 2-0 v-loc suture in connell fashion.  A 0-silk suture was placed along the staple line of the hepatobiliary limb to the roux limb near the anastomosis to prevent obstruction.  The jejunojejunostomy mesenteric defect was closed using running 0-silk suture.   The retro-roux defect was closed, closing the roux limb mesentery to the transverse mesocolon mesentery using a running 0-Silk suture.   I ran the roux limb from the gastrojejunal anastomosis to the jejunojejunostomy and found the anatomy as expected without any twisting of the mesentery.  The intra-abdominal pressure was decreased to for the remainder of the case to check for hemostasis.  I then transitioned to endoscopic portion of the procedure.  The adult upper endoscope was inserted into the pouch.  The pouch appeared appropriately sized.  There was good intra-luminal hemostasis.  The endoscope was inserted through the anastomosis into the roux limb.  The anastomosis appeared well formed without any stricture.  The anastomosis was submerged in irrigation in the left upper quadrant and there was no leakage of air bubbles with endoscopic insufflation suggesting a negative leak test and an air tight anastomosis.    The foregut was decompressed with the endoscope and the endoscope was removed.  The robotic instruments were removed and  the robot was undocked.  The stapler port in the right abdomen was closed at the fascial level using 0-vicryl on a PMI suture passer.  The liver retractor was removed under direct vision.  The pneumoperitoneum was evacuated.  The skin was closed using 4-0 Monocryl and Dermabond.  All sponge and needle counts were correct at the end of the case.    Deward Foy, MD General, Bariatric, & Minimally Invasive Surgery [facility], [place]  "

## 2024-02-08 NOTE — Progress Notes (Signed)
 Pt told nurse that he received a full cup of water  downstairs in the PACU and he drank it all.  Pt was complaining of cramping pain upon arrival to the unit.  Pt started on his 2oz cups of water  after getting pain medication.  No nausea noted.

## 2024-02-09 ENCOUNTER — Other Ambulatory Visit (HOSPITAL_COMMUNITY): Payer: Self-pay

## 2024-02-09 ENCOUNTER — Encounter (HOSPITAL_COMMUNITY): Payer: Self-pay | Admitting: Surgery

## 2024-02-09 LAB — CBC WITH DIFFERENTIAL/PLATELET
Abs Immature Granulocytes: 0.08 10*3/uL — ABNORMAL HIGH (ref 0.00–0.07)
Basophils Absolute: 0 10*3/uL (ref 0.0–0.1)
Basophils Relative: 0 %
Eosinophils Absolute: 0 10*3/uL (ref 0.0–0.5)
Eosinophils Relative: 0 %
HCT: 44 % (ref 39.0–52.0)
Hemoglobin: 14.6 g/dL (ref 13.0–17.0)
Immature Granulocytes: 1 %
Lymphocytes Relative: 11 %
Lymphs Abs: 1.9 10*3/uL (ref 0.7–4.0)
MCH: 27.7 pg (ref 26.0–34.0)
MCHC: 33.2 g/dL (ref 30.0–36.0)
MCV: 83.5 fL (ref 80.0–100.0)
Monocytes Absolute: 1.3 10*3/uL — ABNORMAL HIGH (ref 0.1–1.0)
Monocytes Relative: 7 %
Neutro Abs: 14.1 10*3/uL — ABNORMAL HIGH (ref 1.7–7.7)
Neutrophils Relative %: 81 %
Platelets: 378 10*3/uL (ref 150–400)
RBC: 5.27 MIL/uL (ref 4.22–5.81)
RDW: 13.1 % (ref 11.5–15.5)
WBC: 17.3 10*3/uL — ABNORMAL HIGH (ref 4.0–10.5)
nRBC: 0 % (ref 0.0–0.2)

## 2024-02-09 MED ORDER — ONDANSETRON 4 MG PO TBDP
4.0000 mg | ORAL_TABLET | Freq: Four times a day (QID) | ORAL | 0 refills | Status: AC | PRN
  Filled 2024-02-09: qty 20, 5d supply, fill #0

## 2024-02-09 MED ORDER — PANTOPRAZOLE SODIUM 40 MG PO TBEC
40.0000 mg | DELAYED_RELEASE_TABLET | Freq: Every day | ORAL | 0 refills | Status: AC
  Filled 2024-02-09: qty 90, 90d supply, fill #0

## 2024-02-09 MED ORDER — OXYCODONE HCL 5 MG PO TABS
5.0000 mg | ORAL_TABLET | Freq: Four times a day (QID) | ORAL | 0 refills | Status: AC | PRN
  Filled 2024-02-09: qty 5, 2d supply, fill #0

## 2024-02-09 MED ORDER — ACETAMINOPHEN 500 MG PO TABS: 1000.0000 mg | ORAL_TABLET | Freq: Three times a day (TID) | ORAL | Status: AC

## 2024-02-09 MED ORDER — GABAPENTIN 100 MG PO CAPS
200.0000 mg | ORAL_CAPSULE | Freq: Two times a day (BID) | ORAL | 0 refills | Status: AC
  Filled 2024-02-09: qty 20, 5d supply, fill #0

## 2024-02-09 NOTE — Progress Notes (Signed)
 Discharge medications delivered to patient at the bedside in a secure bag.

## 2024-02-09 NOTE — Progress Notes (Signed)
" °   02/09/24 0850  TOC Brief Assessment  Insurance and Status Reviewed  Patient has primary care physician Yes  Home environment has been reviewed resides in a private residence  Prior level of function: Independent  Prior/Current Home Services No current home services  Social Drivers of Health Review SDOH reviewed no interventions necessary  Readmission risk has been reviewed Yes  Transition of care needs no transition of care needs at this time    "

## 2024-02-09 NOTE — Discharge Summary (Signed)
" °  Patient ID: Donald [name] [id number] 39 y.o. 1986-01-04  02/08/2024  Discharge date and time: 02/09/2024  Admitting Physician: Deward PARAS Cara Aguino  Discharge Physician: Deward PARAS Ladell Bey  Admission Diagnoses: Morbid obesity with BMI of 40.0-44.9, adult (HCC) [E66.01, Z68.41] Patient Active Problem List   Diagnosis Date Noted   Morbid obesity with BMI of 40.0-44.9, adult (HCC) 02/08/2024   Erectile dysfunction 03/28/2019   HTN (hypertension) 10/19/2018   First degree AV block 09/15/2018   GERD (gastroesophageal reflux disease) 05/24/2018   Melena 02/25/2018   Morbid obesity (HCC) 05/10/2017   GAD (generalized anxiety disorder) 10/09/2016   Dyspepsia 03/20/2014     Discharge Diagnoses:  Patient Active Problem List   Diagnosis Date Noted   Morbid obesity with BMI of 40.0-44.9, adult (HCC) 02/08/2024   Erectile dysfunction 03/28/2019   HTN (hypertension) 10/19/2018   First degree AV block 09/15/2018   GERD (gastroesophageal reflux disease) 05/24/2018   Melena 02/25/2018   Morbid obesity (HCC) 05/10/2017   GAD (generalized anxiety disorder) 10/09/2016   Dyspepsia 03/20/2014    Operations: Procedures: CREATION, GASTRIC BYPASS, ROUX-EN-Y, ROBOT-ASSISTED ENDOSCOPY, UPPER GI TRACT  Admission Condition: good  Discharged Condition: good  Indication for Admission: Obesity  Hospital Course: Robotic gastric bypass  Consults: None  Significant Diagnostic Studies: None  Treatments: surgery: As above  Disposition: Home  Patient Instructions:  Allergies as of 02/09/2024   No Known Allergies      Medication List     TAKE these medications    acetaminophen  500 MG tablet Commonly known as: TYLENOL  Take 2 tablets (1,000 mg total) by mouth every 8 (eight) hours for 5 days.   amLODipine  5 MG tablet Commonly known as: NORVASC  Take 1 tablet (5 mg total) by mouth daily.   augmented betamethasone dipropionate 0.05 % cream Commonly known as: DIPROLENE-AF Apply  1 application topically 2 (two) times daily as needed (for psoriasis).   gabapentin  100 MG capsule Commonly known as: NEURONTIN  Take 2 capsules (200 mg total) by mouth every 12 (twelve) hours.   ibuprofen 200 MG tablet Commonly known as: ADVIL Take 400 mg by mouth every 8 (eight) hours as needed (pain.).   ondansetron  4 MG disintegrating tablet Commonly known as: ZOFRAN -ODT Take 1 tablet (4 mg total) by mouth every 6 (six) hours as needed for nausea or vomiting.   oxyCODONE  5 MG immediate release tablet Commonly known as: Oxy IR/ROXICODONE  Take 1 tablet (5 mg total) by mouth every 6 (six) hours as needed for breakthrough pain or severe pain (pain score 7-10).   pantoprazole  40 MG tablet Commonly known as: PROTONIX  Take 1 tablet (40 mg total) by mouth daily.   sildenafil  100 MG tablet Commonly known as: VIAGRA  TAKE 1/2 TO ONE TABLET BY MOUTH EVERY DAY AS NEEDED   VITAMIN B-1 PO Take 1 tablet by mouth at bedtime.        Activity: no heavy lifting for 4 weeks Diet: regular diet Wound Care: keep wound clean and dry  Follow-up:  With Dr. Lyndel  Signed: Deward PARAS Helga Asbury General, Bariatric, & Minimally Invasive Surgery [facility], [place]   02/09/2024, 8:51 AM  "

## 2024-02-14 ENCOUNTER — Telehealth (HOSPITAL_COMMUNITY): Payer: Self-pay | Admitting: *Deleted

## 2024-02-22 ENCOUNTER — Encounter

## 2024-06-06 ENCOUNTER — Encounter: Admitting: Dietician

## 2024-07-05 ENCOUNTER — Ambulatory Visit: Admitting: Family Medicine
